# Patient Record
Sex: Female | Born: 1965 | Race: White | Hispanic: No | Marital: Single | Smoking: Never smoker
Health system: Southern US, Community
[De-identification: ages and names within clinical notes are randomized; demographics above are authoritative.]

## PROBLEM LIST (undated history)

## (undated) DIAGNOSIS — J309 Allergic rhinitis, unspecified: Secondary | ICD-10-CM

## (undated) DIAGNOSIS — J45909 Unspecified asthma, uncomplicated: Secondary | ICD-10-CM

## (undated) DIAGNOSIS — L309 Dermatitis, unspecified: Secondary | ICD-10-CM

## (undated) HISTORY — DX: Allergic rhinitis, unspecified: J30.9

## (undated) HISTORY — DX: Unspecified asthma, uncomplicated: J45.909

## (undated) HISTORY — DX: Dermatitis, unspecified: L30.9

## (undated) HISTORY — PX: NO PAST SURGERIES: SHX2092

---

## 1998-05-31 ENCOUNTER — Other Ambulatory Visit: Admission: RE | Admit: 1998-05-31 | Discharge: 1998-05-31 | Payer: Self-pay | Admitting: Obstetrics and Gynecology

## 1999-07-01 ENCOUNTER — Other Ambulatory Visit: Admission: RE | Admit: 1999-07-01 | Discharge: 1999-07-01 | Payer: Self-pay | Admitting: Obstetrics and Gynecology

## 1999-11-08 ENCOUNTER — Other Ambulatory Visit: Admission: RE | Admit: 1999-11-08 | Discharge: 1999-11-08 | Payer: Self-pay | Admitting: Obstetrics and Gynecology

## 2000-07-12 ENCOUNTER — Other Ambulatory Visit: Admission: RE | Admit: 2000-07-12 | Discharge: 2000-07-12 | Payer: Self-pay | Admitting: Obstetrics and Gynecology

## 2001-01-23 ENCOUNTER — Other Ambulatory Visit: Admission: RE | Admit: 2001-01-23 | Discharge: 2001-01-23 | Payer: Self-pay | Admitting: Obstetrics and Gynecology

## 2001-07-22 ENCOUNTER — Other Ambulatory Visit: Admission: RE | Admit: 2001-07-22 | Discharge: 2001-07-22 | Payer: Self-pay | Admitting: Obstetrics and Gynecology

## 2002-07-28 ENCOUNTER — Other Ambulatory Visit: Admission: RE | Admit: 2002-07-28 | Discharge: 2002-07-28 | Payer: Self-pay | Admitting: Obstetrics and Gynecology

## 2003-02-19 ENCOUNTER — Encounter: Admission: RE | Admit: 2003-02-19 | Discharge: 2003-02-19 | Payer: Self-pay | Admitting: Family Medicine

## 2003-02-24 ENCOUNTER — Encounter: Admission: RE | Admit: 2003-02-24 | Discharge: 2003-03-23 | Payer: Self-pay | Admitting: Family Medicine

## 2003-10-28 ENCOUNTER — Other Ambulatory Visit: Admission: RE | Admit: 2003-10-28 | Discharge: 2003-10-28 | Payer: Self-pay | Admitting: Obstetrics and Gynecology

## 2004-12-26 ENCOUNTER — Other Ambulatory Visit: Admission: RE | Admit: 2004-12-26 | Discharge: 2004-12-26 | Payer: Self-pay | Admitting: Obstetrics and Gynecology

## 2005-07-05 ENCOUNTER — Encounter: Admission: RE | Admit: 2005-07-05 | Discharge: 2005-07-05 | Payer: Self-pay | Admitting: Family Medicine

## 2010-01-09 ENCOUNTER — Ambulatory Visit (HOSPITAL_BASED_OUTPATIENT_CLINIC_OR_DEPARTMENT_OTHER): Admission: RE | Admit: 2010-01-09 | Discharge: 2010-01-09 | Payer: Self-pay | Admitting: Emergency Medicine

## 2010-01-09 ENCOUNTER — Ambulatory Visit: Payer: Self-pay | Admitting: Diagnostic Radiology

## 2011-11-03 DIAGNOSIS — Z85828 Personal history of other malignant neoplasm of skin: Secondary | ICD-10-CM | POA: Insufficient documentation

## 2014-12-21 ENCOUNTER — Other Ambulatory Visit: Payer: Self-pay | Admitting: Pediatrics

## 2015-12-03 ENCOUNTER — Encounter: Payer: Self-pay | Admitting: Pediatrics

## 2015-12-03 ENCOUNTER — Ambulatory Visit (INDEPENDENT_AMBULATORY_CARE_PROVIDER_SITE_OTHER): Payer: 59 | Admitting: Pediatrics

## 2015-12-03 VITALS — BP 102/80 | HR 72 | Temp 98.5°F | Resp 16 | Ht 64.25 in | Wt 160.6 lb

## 2015-12-03 DIAGNOSIS — J302 Other seasonal allergic rhinitis: Secondary | ICD-10-CM | POA: Insufficient documentation

## 2015-12-03 DIAGNOSIS — Z8669 Personal history of other diseases of the nervous system and sense organs: Secondary | ICD-10-CM | POA: Insufficient documentation

## 2015-12-03 DIAGNOSIS — J4540 Moderate persistent asthma, uncomplicated: Secondary | ICD-10-CM | POA: Insufficient documentation

## 2015-12-03 DIAGNOSIS — J3089 Other allergic rhinitis: Secondary | ICD-10-CM | POA: Diagnosis not present

## 2015-12-03 DIAGNOSIS — Z79899 Other long term (current) drug therapy: Secondary | ICD-10-CM | POA: Insufficient documentation

## 2015-12-03 MED ORDER — AZELASTINE HCL 0.1 % NA SOLN: 2.0000 | Freq: Two times a day (BID) | NASAL | 3 refills | Status: DC | PRN

## 2015-12-03 MED ORDER — MOMETASONE FUROATE 220 MCG/INH IN AEPB: 1.0000 | INHALATION_SPRAY | Freq: Two times a day (BID) | RESPIRATORY_TRACT | 3 refills | Status: DC

## 2015-12-03 MED ORDER — ALBUTEROL SULFATE HFA 108 (90 BASE) MCG/ACT IN AERS: 2.0000 | INHALATION_SPRAY | RESPIRATORY_TRACT | 0 refills | Status: DC | PRN

## 2015-12-03 MED ORDER — MONTELUKAST SODIUM 10 MG PO TABS: 10.0000 mg | ORAL_TABLET | Freq: Every day | ORAL | 3 refills | Status: DC

## 2015-12-03 NOTE — Patient Instructions (Addendum)
Asmanex 220-one puff once or twice a day to prevent coughing or wheezing Ventolin 2 puffs every 4 hours if needed for wheezing or coughing spells Montelukast  10 mg once a day for coughing or wheezing Zyrtec 10 mg once a day for runny nose Rhinocort 1 spray per nostril twice a day if needed for stuffy nose Azelastine 0.1%-2 sprays per nostril twice a day if needed for sinus headache Add prednisone 10 mg twice a day for 4 days, 10 mg on the fifth day. Did the sinus pressure improve? You should have a flu vaccination

## 2015-12-03 NOTE — Progress Notes (Signed)
[address] [place] [address] Dept: [phone]  FOLLOW UP NOTE  Patient ID: Karina Phillips, female    DOB: 06/24/1965  Age: 50 y.o. MRN: [id number] Date of Office Visit: 12/03/2015  Assessment  Chief Complaint: Asthma (cough that lasted 8 weeks, no cough now); Allergies (pain in face and pressure in head, worse in the last year); and Nasal Congestion  HPI Karina Phillips presents for follow-up of asthma and allergic rhinitis.  She usually uses Asmanex 22O-one  puff once a day but may need to use it twice a day if her asthma is not well controlled in  the springtime. She has been complaining of some sinus pressure. Decongestants give her a rapid heartbeat.  Current medications Asmanex 220- one puff once or twice a day, Ventolin 2 puffs every 4 hours if needed, cetirizine 10 mg once a day, montelukast  10 mg once a day. Her other medications are outlined in the chart.   Drug Allergies:  Allergies  Allergen Reactions  . Flexeril [Cyclobenzaprine] Rash  . Penicillin G Rash    Physical Exam: BP 102/80   Pulse 72   Temp 98.5 F (36.9 C) (Oral)   Resp 16   Ht 5' 4.25" (1.632 m)   Wt 160 lb 9.6 oz (72.8 kg)   BMI 27.35 kg/m    Physical Exam  Constitutional: She is oriented to person, place, and time. She appears well-developed and well-nourished.  HENT:  Eyes normal. Ears normal. Nose moderate swelling of nasal turbinates. Pharynx normal.  Neck: Neck supple.  Cardiovascular:  S1 and S2 normal no murmurs  Pulmonary/Chest:  Clear to percussion and auscultation  Lymphadenopathy:    She has no cervical adenopathy.  Neurological: She is alert and oriented to person, place, and time.  Psychiatric: She has a normal mood and affect. Her behavior is normal. Judgment and thought content normal.  Vitals reviewed.   Diagnostics:  FVC 3.07 L FEV1 2.84 L. Predicted FVC 3.60 L predicted FEV1 2.7 L-the spirometry is in the normal range  Assessment and Plan: 1. Moderate  persistent asthma, uncomplicated   2. Other allergic rhinitis   3. Current use of beta blocker   4. History of migraine headaches     Meds ordered this encounter  Medications  . albuterol (VENTOLIN HFA) 108 (90 Base) MCG/ACT inhaler    Sig: Inhale 2 puffs into the lungs every 4 (four) hours as needed for wheezing or shortness of breath.    Dispense:  3 Inhaler    Refill:  0  . montelukast (SINGULAIR) 10 MG tablet    Sig: Take 1 tablet (10 mg total) by mouth at bedtime.    Dispense:  90 tablet    Refill:  3    90 day supply  . mometasone (ASMANEX) 220 MCG/INH inhaler    Sig: Inhale 1 puff into the lungs 2 (two) times daily.    Dispense:  3 Inhaler    Refill:  3    90 day supply  . azelastine (ASTELIN) 0.1 % nasal spray    Sig: Place 2 sprays into both nostrils 2 (two) times daily as needed for rhinitis.    Dispense:  90 mL    Refill:  3    90 day supply    Patient Instructions  Asmanex 220-one puff once or twice a day to prevent coughing or wheezing Ventolin 2 puffs every 4 hours if needed for wheezing or coughing spells Montelukast  10 mg once a  day for coughing or wheezing Zyrtec 10 mg once a day for runny nose Rhinocort 1 spray per nostril twice a day if needed for stuffy nose Azelastine 0.1%-2 sprays per nostril twice a day if needed for sinus headache Add prednisone 10 mg twice a day for 4 days, 10 mg on the fifth day. Did the sinus pressure improve? You should have a flu vaccination   Return in about 6 weeks (around 01/14/2016).    Thank you for the opportunity to care for this patient.  Please do not hesitate to contact me with questions.  Tonette Bihari, M.D.  [facility] [address] [place], [place] [address] [phone]

## 2015-12-29 DIAGNOSIS — J45909 Unspecified asthma, uncomplicated: Secondary | ICD-10-CM | POA: Insufficient documentation

## 2016-01-04 ENCOUNTER — Encounter: Payer: Self-pay | Admitting: Pediatrics

## 2016-01-04 ENCOUNTER — Ambulatory Visit (INDEPENDENT_AMBULATORY_CARE_PROVIDER_SITE_OTHER): Payer: 59 | Admitting: Pediatrics

## 2016-01-04 VITALS — BP 100/70 | HR 68 | Temp 98.0°F | Resp 16

## 2016-01-04 DIAGNOSIS — J4540 Moderate persistent asthma, uncomplicated: Secondary | ICD-10-CM

## 2016-01-04 DIAGNOSIS — Z79899 Other long term (current) drug therapy: Secondary | ICD-10-CM | POA: Diagnosis not present

## 2016-01-04 DIAGNOSIS — J3089 Other allergic rhinitis: Secondary | ICD-10-CM | POA: Diagnosis not present

## 2016-01-04 DIAGNOSIS — Z23 Encounter for immunization: Secondary | ICD-10-CM | POA: Diagnosis not present

## 2016-01-04 NOTE — Patient Instructions (Signed)
Continue on her current treatment plan She received the flu vaccination today Call me if you are not doing well on this treatment plan

## 2016-01-04 NOTE — Progress Notes (Signed)
  [address] [place] [address] Dept: [phone]  FOLLOW UP NOTE  Patient ID: Karina Phillips Piech, female    DOB: 11/01/1965  Age: 50 y.o. MRN: [id number] Date of Office Visit: 01/04/2016  Assessment  Chief Complaint: Asthma (doing good) and Allergies (hoarse, runny nose )  HPI Karina Phillips Charlot presents for follow-up of asthma and allergic rhinitis. Her asthma is well controlled. Her last allergy testing for airborne allergens was in 2008 and she showed  some reactivity to dust mite and molds. Her nasal symptoms are well controlled.  Current medications are Asmanex 220-1 puff once a day, Ventolin 2 puffs every 4 hours if needed, cetirizine 10 mg once a day ,montelukast 10 mg once a day. Her other medications are outlined in the chart   Drug Allergies:  Allergies  Allergen Reactions  . Flexeril [Cyclobenzaprine] Rash  . Penicillin G Rash    Physical Exam: BP 100/70   Pulse 68   Temp 98 F (36.7 C) (Oral)   Resp 16    Physical Exam  Constitutional: She is oriented to person, place, and time. She appears well-developed and well-nourished.  HENT:  Eyes normal. Ears normal. Nose normal. Pharynx normal.  Neck: Neck supple.  Cardiovascular:  S1 and S2 normal no murmurs  Pulmonary/Chest:  Clear to percussion and auscultation  Lymphadenopathy:    She has no cervical adenopathy.  Neurological: She is alert and oriented to person, place, and time.  Psychiatric: She has a normal mood and affect. Her behavior is normal. Judgment and thought content normal.  Vitals reviewed.   Diagnostics:  FVC 2.98 L FEV1 2.82 L. Predicted FVC 3.60 L predicted FEV1 2.87 L-the spirometry is in the normal range  Assessment and Plan: 1. Need for prophylactic vaccination and inoculation against influenza   2. Moderate persistent asthma without complication   3. Other allergic rhinitis   4. Current use of beta blocker     No orders of the defined types were placed in this  encounter.   Patient Instructions  Continue on her current treatment plan She received the flu vaccination today Call me if you are not doing well on this treatment plan   Return in about 6 months (around 07/03/2016).    Thank you for the opportunity to care for this patient.  Please do not hesitate to contact me with questions.  Tonette BihariJ. A. Deissy Guilbert, M.Phillips.  [facility] [address] [place], [place] [address] [phone]

## 2016-10-05 DIAGNOSIS — Z0000 Encounter for general adult medical examination without abnormal findings: Secondary | ICD-10-CM | POA: Insufficient documentation

## 2016-11-13 DIAGNOSIS — D7589 Other specified diseases of blood and blood-forming organs: Secondary | ICD-10-CM | POA: Insufficient documentation

## 2016-11-27 ENCOUNTER — Other Ambulatory Visit: Payer: Self-pay | Admitting: Pediatrics

## 2016-11-27 NOTE — Telephone Encounter (Signed)
RF on Singulair 10 mg given x 1 with no refills, pt needs an OV

## 2016-12-12 ENCOUNTER — Encounter: Payer: Self-pay | Admitting: Pediatrics

## 2016-12-12 ENCOUNTER — Ambulatory Visit (INDEPENDENT_AMBULATORY_CARE_PROVIDER_SITE_OTHER): Payer: 59 | Admitting: Pediatrics

## 2016-12-12 VITALS — BP 104/68 | HR 58 | Temp 98.5°F | Resp 16 | Ht 64.2 in | Wt 164.4 lb

## 2016-12-12 DIAGNOSIS — Z8669 Personal history of other diseases of the nervous system and sense organs: Secondary | ICD-10-CM

## 2016-12-12 DIAGNOSIS — J3089 Other allergic rhinitis: Secondary | ICD-10-CM | POA: Diagnosis not present

## 2016-12-12 DIAGNOSIS — J4540 Moderate persistent asthma, uncomplicated: Secondary | ICD-10-CM

## 2016-12-12 DIAGNOSIS — H6982 Other specified disorders of Eustachian tube, left ear: Secondary | ICD-10-CM | POA: Diagnosis not present

## 2016-12-12 MED ORDER — BUDESONIDE 180 MCG/ACT IN AEPB: INHALATION_SPRAY | RESPIRATORY_TRACT | 2 refills | Status: DC

## 2016-12-12 MED ORDER — ALBUTEROL SULFATE HFA 108 (90 BASE) MCG/ACT IN AERS: 2.0000 | INHALATION_SPRAY | RESPIRATORY_TRACT | 1 refills | Status: DC | PRN

## 2016-12-12 MED ORDER — MONTELUKAST SODIUM 10 MG PO TABS: ORAL_TABLET | ORAL | 3 refills | Status: DC

## 2016-12-12 NOTE — Patient Instructions (Addendum)
Continue using Rhinocort 1 spray per nostril twice a day for stuffy nose if needed Continue cetirizine 10 mg once a day  Continue montelukast 10 mg once a day Begin using Pulmicort 180- 2 puffs once a day to prevent  cough or wheeze Continue Ventolin 2 puffs every 4 hours if needed for cough or wheezing Get a flu shot Continue other medications as listed in the chart Follow up in 1 year  Call us if you're not doing well on this treatment plan these prescription

## 2016-12-12 NOTE — Progress Notes (Signed)
[address] [place] [address] Dept: [phone]  FOLLOW UP NOTE  Patient ID: Karina Phillips, female    DOB: 10-22-1965  Age: 51 y.o. MRN: [id number] Date of Office Visit: 12/12/2016  Assessment  Chief Complaint: Asthma (new ins will not pay for Asmanex.)  HPI Karina Phillips presents for follow-up of asthma and allergic rhinitis. She reports her asthma is well controlled on Asmanex 220 with 1 puff a day. She reports using Ventolin approximately 1 time every 3 months and occasionally before exercise.She has not experienced nocturnal awakenings due to lower respiratory symptoms, nor have activities of daily living been limited. She has required no Emergency Department or Urgent Care visits for her asthma. She has required zero courses of systemic steroids for asthma exacerbations since the last visit. She reports allergic rhinitis is well controlled with Rhinocort 1 spray twice a day and has also been using acupuncture. She does not have any sinus pressure but reports a fullness in her left ear with cracking and popping sounds that is not painful and began about 1 week ago. She denies fever, chills, or sweats.  Drug Allergies:  Allergies  Allergen Reactions  . Flexeril [Cyclobenzaprine] Rash  . Penicillin G Rash    Physical Exam: BP 104/68 (BP Location: Left Arm, Patient Position: Sitting, Cuff Size: Normal)   Pulse (!) 58   Temp 98.5 F (36.9 C) (Oral)   Resp 16   Ht 5' 4.2" (1.631 m)   Wt 164 lb 6.4 oz (74.6 kg)   SpO2 97%   BMI 28.04 kg/m    Physical Exam  Constitutional: She is oriented to person, place, and time. She appears well-developed and well-nourished.  HENT:  Eyes normal. Ears normal. Nose normal. Pharynx normal  Neck: Normal range of motion. Neck supple.  Cardiovascular:  S1-S2 normal. Regular heart rate and rhythm  Pulmonary/Chest: Breath sounds normal.  Lungs clear to auscultation  Musculoskeletal: Normal range of motion.  Neurological: She is  alert and oriented to person, place, and time.  Skin: Skin is warm and dry.  Psychiatric: She has a normal mood and affect. Her behavior is normal. Judgment and thought content normal.    Diagnostics: FEV1 3.05 FVC 3.38. FEV1 predicted 2.84 FVC predicted 3.58. The  spirometry is in the normal range.    Assessment and Plan: 1. Moderate persistent asthma without complication   2. Other allergic rhinitis   3. History of migraine headaches   4. Eustachian tube dysfunction, left     Meds ordered this encounter  Medications  . budesonide (PULMICORT FLEXHALER) 180 MCG/ACT inhaler    Sig: Two puffs once a day to prevent cough or wheeze. Rinse gargle and spit after use.    Dispense:  3 Inhaler    Refill:  2    HOLD RX.  PATIENT WILL CALL FOR FILL.  Marland Kitchen montelukast (SINGULAIR) 10 MG tablet    Sig: One tablet at bedtime to prevent cough or wheeze.    Dispense:  90 tablet    Refill:  3  . albuterol (VENTOLIN HFA) 108 (90 Base) MCG/ACT inhaler    Sig: Inhale 2 puffs into the lungs every 4 (four) hours as needed for wheezing or shortness of breath.    Dispense:  3 Inhaler    Refill:  1    Patient Instructions  Continue using Rhinocort 1 spray per nostril twice a day for stuffy nose if needed Continue cetirizine 10 mg once a day  Continue montelukast 10 mg  once a day Begin using Pulmicort 180- 2 puffs once a day to prevent  cough or wheeze Continue Ventolin 2 puffs every 4 hours if needed for cough or wheezing Get a flu shot Continue other medications as listed in the chart Follow up in 1 year  Call us if you're not doing well on this treatment plan     Return in about 1 year (around 12/12/2017), or if symptoms worsen or fail to improve.    Thank you for the opportunity to care for this patient.  Please do not hesitate to contact me with questions.  Tonette Bihari, M.D.  [facility] [address] [place], [place] [address] [phone]

## 2017-01-12 ENCOUNTER — Other Ambulatory Visit: Payer: Self-pay

## 2017-01-12 ENCOUNTER — Telehealth: Payer: Self-pay | Admitting: Pediatrics

## 2017-01-12 MED ORDER — PREDNISONE 10 MG PO TABS: ORAL_TABLET | ORAL | 0 refills | Status: DC

## 2017-01-12 NOTE — Telephone Encounter (Signed)
Patient saw Dr. Beaulah DinningBardelas on Oct 9. And she had fluid in her ears. She said Dr. Beaulah DinningBardelas told her if it didn't get better to call and he would prescribe prednisone. She said it hasn't gotten any better and would like this called in to [facility] in [place]. She gave me 2 numbers. [phone] or Cell:[phone].

## 2017-01-12 NOTE — Telephone Encounter (Signed)
Sent in prednisone informed pt

## 2017-01-12 NOTE — Telephone Encounter (Signed)
Please advise if its ok to send in prednisone

## 2017-01-12 NOTE — Telephone Encounter (Signed)
Go ahead and call in prednisone 10 mg tablets. Take 1 tablet twice a day for 4 days, take 1 tablet on the fifth day

## 2017-07-11 ENCOUNTER — Other Ambulatory Visit: Payer: Self-pay | Admitting: Obstetrics and Gynecology

## 2017-07-11 DIAGNOSIS — R928 Other abnormal and inconclusive findings on diagnostic imaging of breast: Secondary | ICD-10-CM

## 2017-07-13 ENCOUNTER — Ambulatory Visit: Payer: Self-pay

## 2017-07-13 ENCOUNTER — Ambulatory Visit
Admission: RE | Admit: 2017-07-13 | Discharge: 2017-07-13 | Disposition: A | Discharge status: Home / Self Care | Payer: 59 | Source: Ambulatory Visit | Attending: Obstetrics and Gynecology | Admitting: Obstetrics and Gynecology

## 2017-07-13 DIAGNOSIS — R928 Other abnormal and inconclusive findings on diagnostic imaging of breast: Secondary | ICD-10-CM

## 2017-11-13 ENCOUNTER — Other Ambulatory Visit: Payer: Self-pay | Admitting: Pediatrics

## 2017-11-13 NOTE — Telephone Encounter (Signed)
Courtesy refill  

## 2017-12-17 ENCOUNTER — Encounter: Payer: Self-pay | Admitting: Pediatrics

## 2017-12-17 ENCOUNTER — Ambulatory Visit (INDEPENDENT_AMBULATORY_CARE_PROVIDER_SITE_OTHER): Payer: 59 | Admitting: Pediatrics

## 2017-12-17 VITALS — BP 106/70 | HR 64 | Temp 98.2°F | Resp 16 | Ht 64.25 in | Wt 171.0 lb

## 2017-12-17 DIAGNOSIS — Z8669 Personal history of other diseases of the nervous system and sense organs: Secondary | ICD-10-CM

## 2017-12-17 DIAGNOSIS — Z23 Encounter for immunization: Secondary | ICD-10-CM

## 2017-12-17 DIAGNOSIS — J3089 Other allergic rhinitis: Secondary | ICD-10-CM

## 2017-12-17 DIAGNOSIS — J4540 Moderate persistent asthma, uncomplicated: Secondary | ICD-10-CM | POA: Diagnosis not present

## 2017-12-17 MED ORDER — MONTELUKAST SODIUM 10 MG PO TABS: ORAL_TABLET | ORAL | 3 refills | Status: DC

## 2017-12-17 MED ORDER — BUDESONIDE 180 MCG/ACT IN AEPB: INHALATION_SPRAY | RESPIRATORY_TRACT | 3 refills | Status: DC

## 2017-12-17 MED ORDER — AZELASTINE HCL 0.1 % NA SOLN: 2.0000 | Freq: Two times a day (BID) | NASAL | 0 refills | Status: DC | PRN

## 2017-12-17 MED ORDER — ALBUTEROL SULFATE HFA 108 (90 BASE) MCG/ACT IN AERS: 2.0000 | INHALATION_SPRAY | RESPIRATORY_TRACT | 1 refills | Status: DC | PRN

## 2017-12-17 NOTE — Progress Notes (Signed)
[address] [place] [address] Dept: [phone]  FOLLOW UP NOTE  Patient ID: Karina Phillips, female    DOB: [birth year]  Age: 52 y.o. MRN: [id number] Date of Office Visit: 12/17/2017  Assessment  Chief Complaint: Asthma  HPI Karina Phillips is a 52 year old female who presents to the clinic for a follow up visit. She reports her asthma as well controlled with only occasional shortness of breath with strenuous activity, about 1 time a month, for which she uses albuterol with resolution of symptoms. Allergic rhinitis is reported as well controlled with Rhinocort, cetirizine and Astelin. Her current medications are listed in the chart.    Drug Allergies:  Allergies  Allergen Reactions  . Flexeril [Cyclobenzaprine] Rash  . Penicillin G Rash    Physical Exam: BP 106/70   Pulse 64   Temp 98.2 F (36.8 C) (Oral)   Resp 16   Ht 5' 4.25" (1.632 m)   Wt 171 lb (77.6 kg)   SpO2 98%   BMI 29.12 kg/m    Physical Exam  Constitutional: She is oriented to person, place, and time. She appears well-developed and well-nourished.  HENT:  Head: Normocephalic.  Right Ear: External ear normal.  Left Ear: External ear normal.  Mouth/Throat: Oropharynx is clear and moist.  Bilateral nares normal. Pharynx normal. Ears normal. Eyes normal.  Eyes: Conjunctivae are normal.  Neck: Normal range of motion. Neck supple.  Cardiovascular: Normal rate and regular rhythm.  No murmur noted  Pulmonary/Chest: Effort normal and breath sounds normal.  Lungs clear to auscultation  Musculoskeletal: Normal range of motion.  Neurological: She is alert and oriented to person, place, and time.  Skin: Skin is warm and dry.  Psychiatric: She has a normal mood and affect. Her behavior is normal. Judgment and thought content normal.  Vitals reviewed.   Diagnostics: FVC 3.09, FEV1 2.87. Predicted FVC 3.56, predicted FEV1 2.82. Spirometry is within the normal range.   Assessment and Plan: 1.  Moderate persistent asthma without complication   2. Other allergic rhinitis   3. History of migraine headaches   4. Need for prophylactic vaccination and inoculation against influenza     Meds ordered this encounter  Medications  . montelukast (SINGULAIR) 10 MG tablet    Sig: TAKE 1 TABLET AT BEDTIME TO PREVENT COUGH OR WHEEZE    Dispense:  90 tablet    Refill:  3  . budesonide (PULMICORT FLEXHALER) 180 MCG/ACT inhaler    Sig: Two puffs once a day to prevent cough or wheeze. Rinse gargle and spit after use.    Dispense:  3 Inhaler    Refill:  3  . albuterol (VENTOLIN HFA) 108 (90 Base) MCG/ACT inhaler    Sig: Inhale 2 puffs into the lungs every 4 (four) hours as needed for wheezing or shortness of breath.    Dispense:  3 Inhaler    Refill:  1  . azelastine (ASTELIN) 0.1 % nasal spray    Sig: Place 2 sprays into both nostrils 2 (two) times daily as needed for rhinitis.    Dispense:  30 mL    Refill:  0    90 day supply    Patient Instructions  Continue using Rhinocort 1 spray in each nostril twice a day as needed for a stuffy nose Continue cetirizine 10 mg once a day as needed for a runny nose Azelastine 0.1%  2 sprays in each nostril twice a day as needed for a sinus headache Continue  montelukast 10 mg once a day to prevent cough or wheeze Continue Pulmicort 180- 2 puffs once a day to prevent  cough or wheeze Continue Ventolin 2 puffs every 4 hours if needed for cough or wheezing. You may use Ventolin 2 puffs 5-15 minutes before activity to decrease cough and wheeze Continue other medications as listed in the chart   Call us if you're not doing well on this treatment plan   Follow up in 1 year or sooner if needed  Return in about 1 year (around 12/18/2018), or if symptoms worsen or fail to improve.   Thank you for the opportunity to care for this patient.  Please do not hesitate to contact me with questions.  Thermon Leyland, FNP [facility]  I have provided oversight concerning Thermon Leyland' evaluation and treatment of this patient's health issues addressed during today's encounter. I agree with the assessment and therapeutic plan as outlined in the note.   Thank you for the opportunity to care for this patient.  Please do not hesitate to contact me with questions.  Tonette Bihari, M.D.  [facility] [address] [place], [place] [address] [phone]

## 2017-12-17 NOTE — Patient Instructions (Addendum)
Continue using Rhinocort 1 spray in each nostril twice a day as needed for a stuffy nose Continue cetirizine 10 mg once a day as needed for a runny nose Azelastine 0.1%  2 sprays in each nostril twice a day as needed for a sinus headache Continue montelukast 10 mg once a day to prevent cough or wheeze Continue Pulmicort 180- 2 puffs once a day to prevent  cough or wheeze Continue Ventolin 2 puffs every 4 hours if needed for cough or wheezing. You may use Ventolin 2 puffs 5-15 minutes before activity to decrease cough and wheeze Continue other medications as listed in the chart   Call us if you're not doing well on this treatment plan   Follow up in 1 year or sooner if needed

## 2018-01-01 DIAGNOSIS — G43009 Migraine without aura, not intractable, without status migrainosus: Secondary | ICD-10-CM | POA: Insufficient documentation

## 2018-01-01 DIAGNOSIS — G4700 Insomnia, unspecified: Secondary | ICD-10-CM | POA: Insufficient documentation

## 2018-07-11 DIAGNOSIS — G43909 Migraine, unspecified, not intractable, without status migrainosus: Secondary | ICD-10-CM | POA: Insufficient documentation

## 2018-10-29 DIAGNOSIS — E538 Deficiency of other specified B group vitamins: Secondary | ICD-10-CM | POA: Insufficient documentation

## 2018-11-18 ENCOUNTER — Other Ambulatory Visit: Payer: Self-pay | Admitting: Family Medicine

## 2018-12-13 ENCOUNTER — Other Ambulatory Visit: Payer: Self-pay | Admitting: Family Medicine

## 2018-12-29 ENCOUNTER — Other Ambulatory Visit: Payer: Self-pay | Admitting: Family Medicine

## 2019-03-08 IMAGING — MG DIGITAL DIAGNOSTIC UNILATERAL LEFT MAMMOGRAM WITH TOMO AND CAD
4 series · 4 of 12 positions shown · non-contrast
Comparison: Previous exam(s).

CLINICAL DATA: Screening recall for a possible mass in the left
breast.

EXAM:
DIGITAL DIAGNOSTIC UNILATERAL LEFT MAMMOGRAM WITH CAD AND TOMO

[L ML synth-2D]
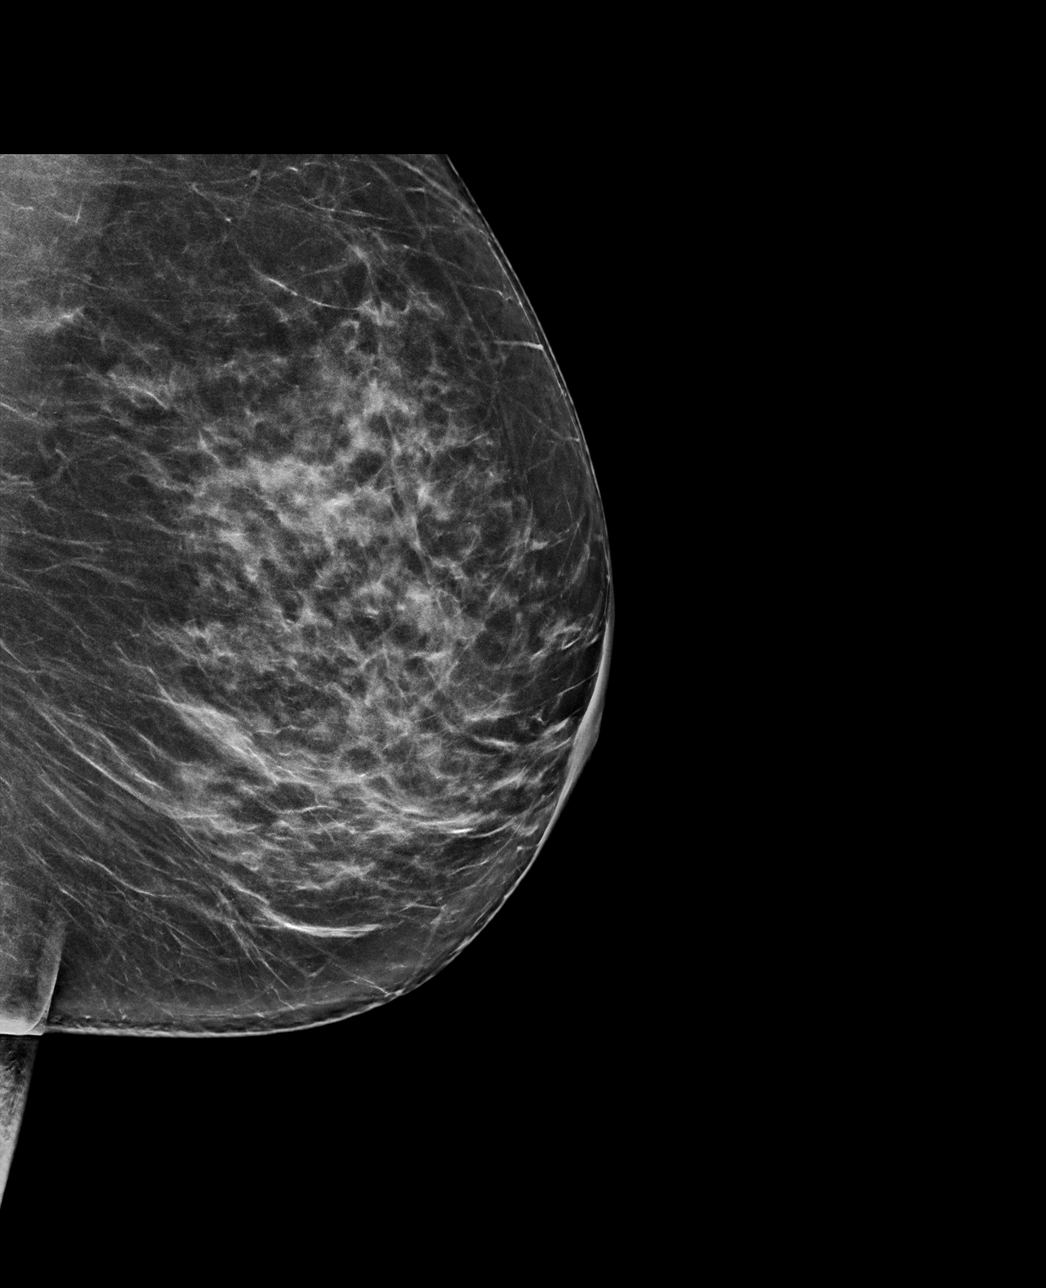

[L MLO synth-2D]
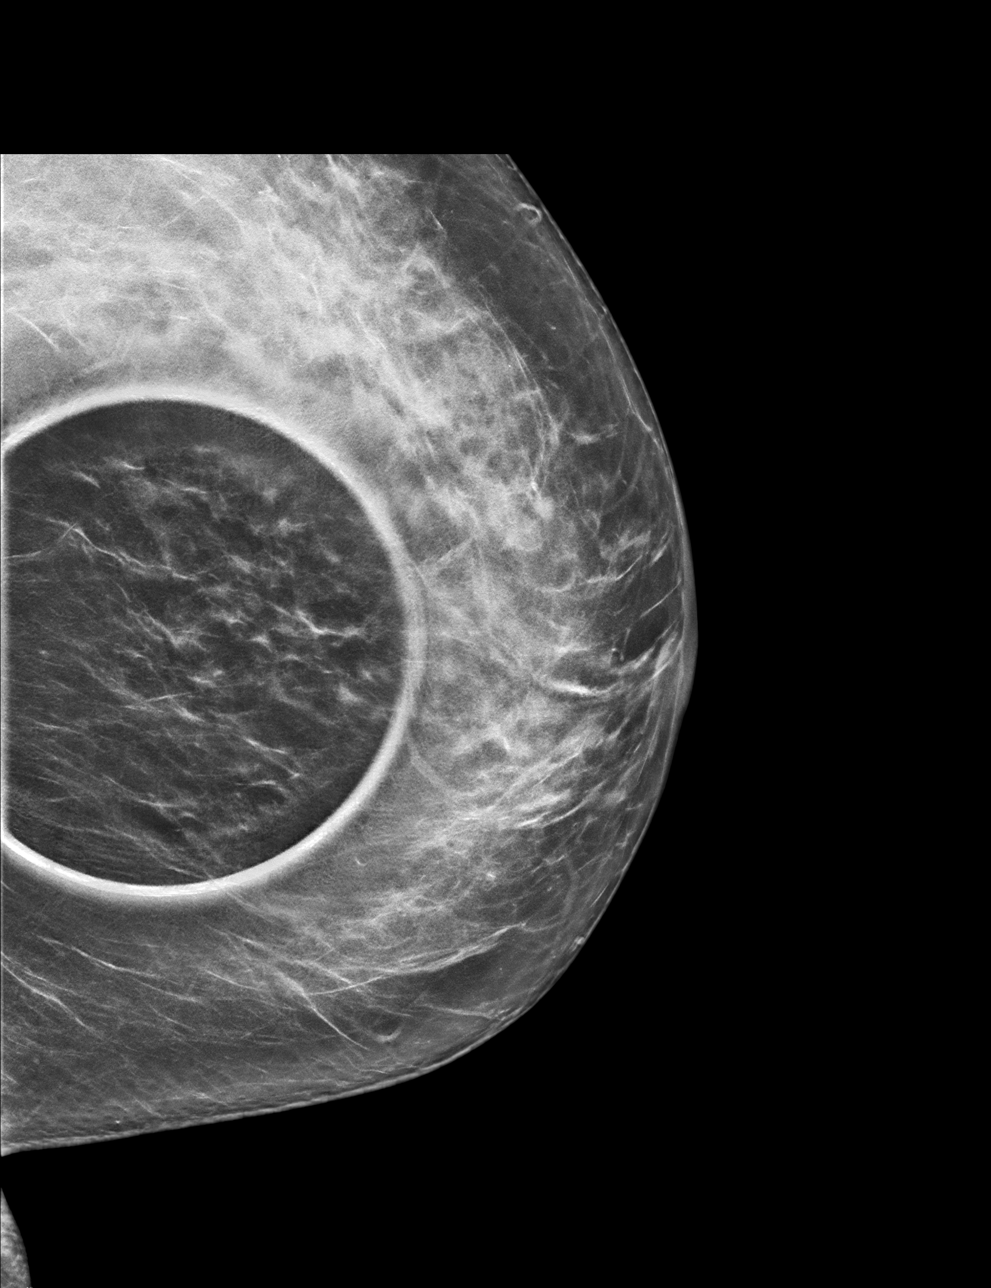

[L MLO tomo · tomo slice 36/71.0]
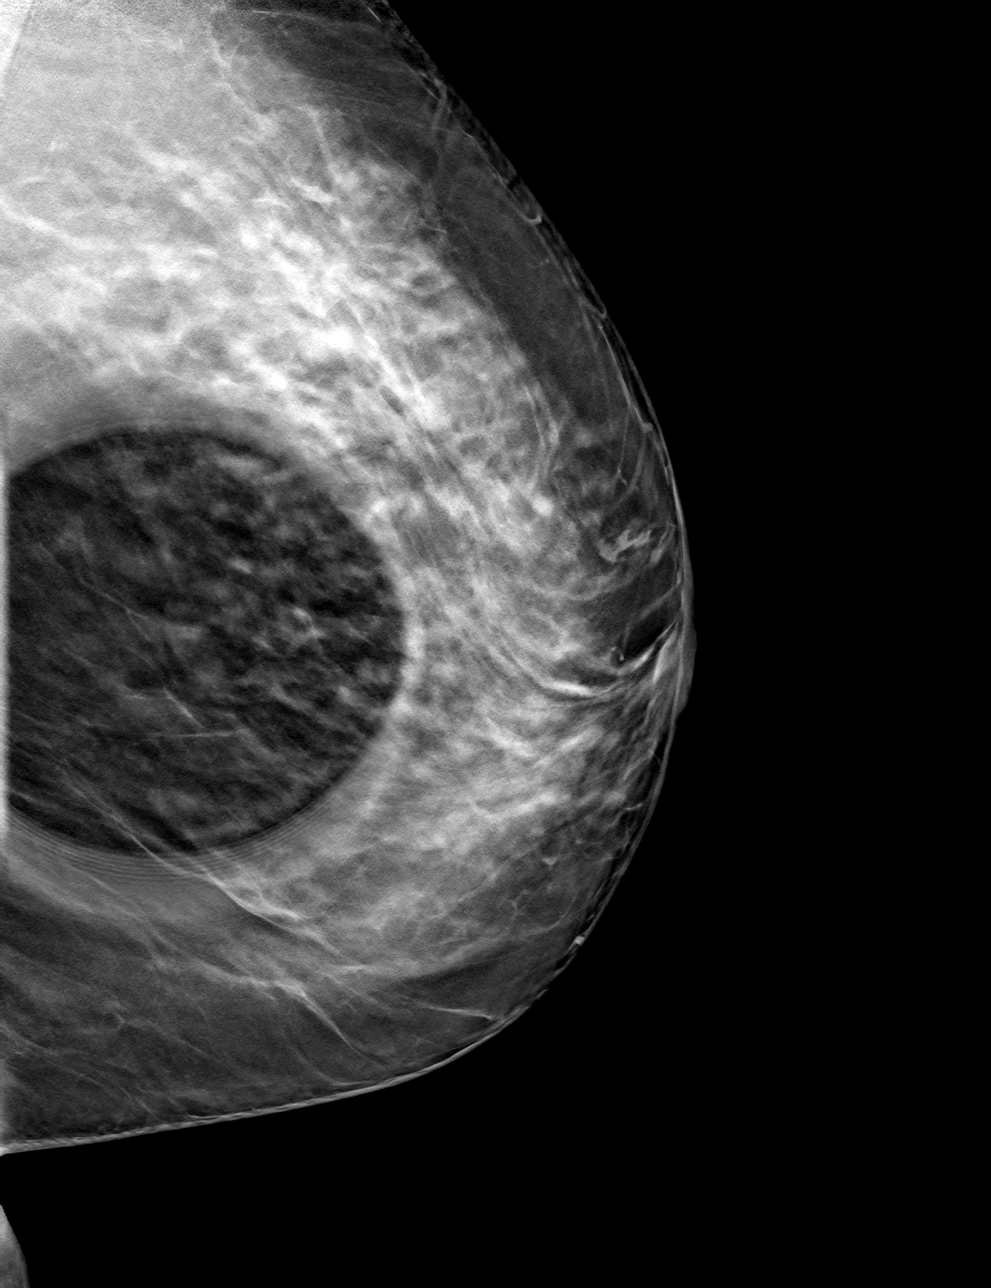

[L ML tomo · tomo slice 40/79.0]
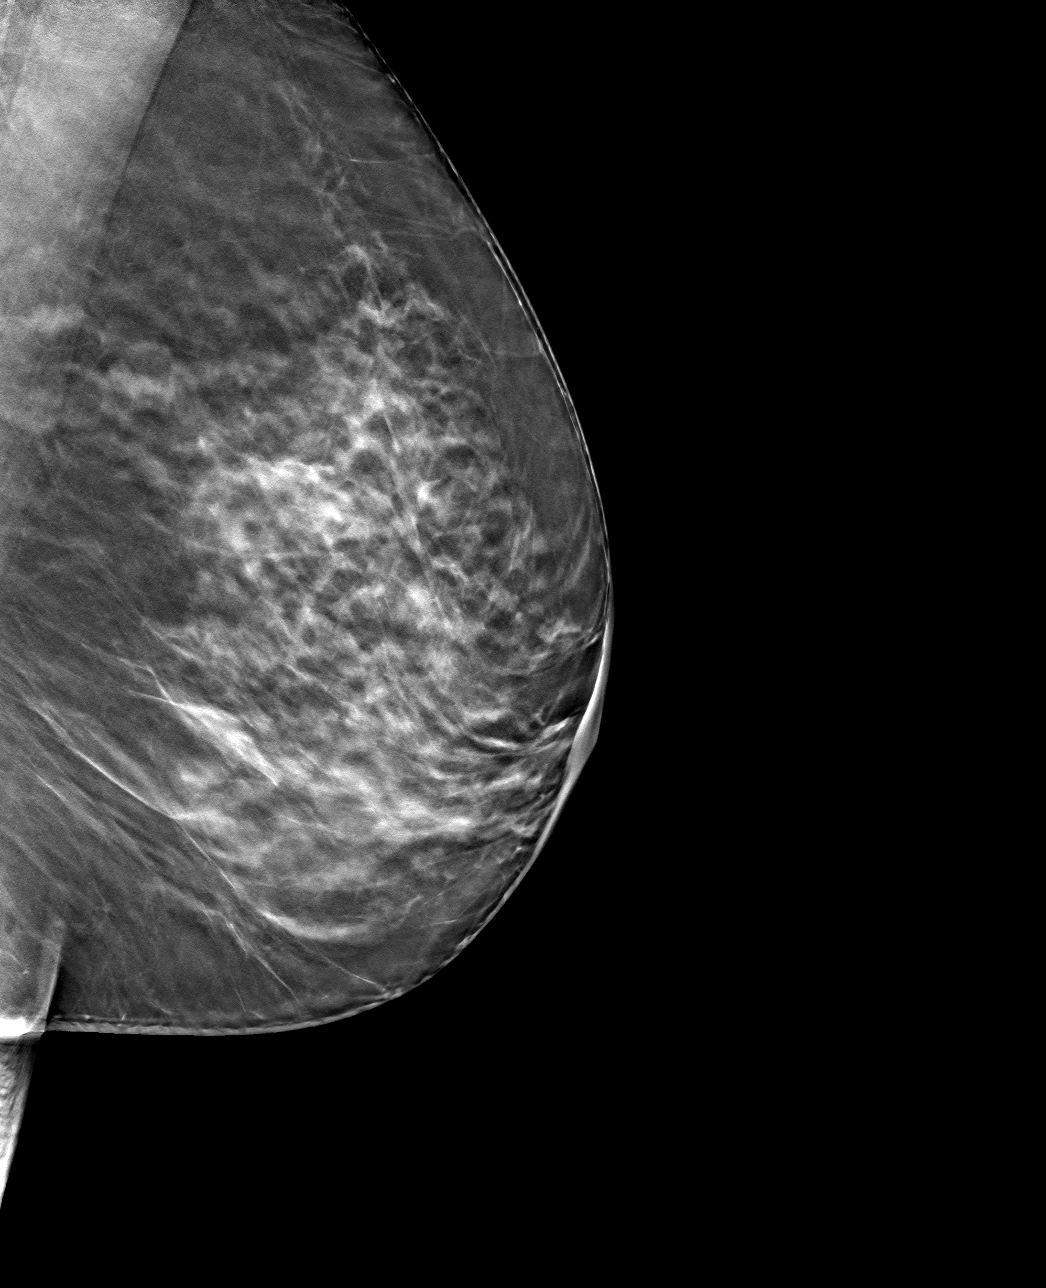

[4 of 12 positions shown; findings below may reference images not displayed]

ACR Breast Density Category c: The breast tissue is heterogeneously
dense, which may obscure small masses.
FINDINGS: The possible mass noted on the screening study, noted on the MLO
view, is not reproduced on the diagnostic mammographic images. There
are no discrete masses, areas of significant asymmetry, areas of
architectural distortion or suspicious calcifications. The screening
abnormality was due to superimposed fibroglandular tissue

Mammographic images were processed with CAD.
IMPRESSION: 1. No evidence of breast malignancy

RECOMMENDATION:
Screening mammogram in one year.(Code:[id number]-[id number])

I have discussed the findings and recommendations with the patient.
Results were also provided in writing at the conclusion of the
visit. If applicable, a reminder letter will be sent to the patient
regarding the next appointment.

BI-RADS CATEGORY  1: Negative.

## 2019-03-18 ENCOUNTER — Ambulatory Visit (INDEPENDENT_AMBULATORY_CARE_PROVIDER_SITE_OTHER): Payer: 59 | Admitting: Pediatrics

## 2019-03-18 ENCOUNTER — Other Ambulatory Visit: Payer: Self-pay

## 2019-03-18 ENCOUNTER — Encounter: Payer: Self-pay | Admitting: Pediatrics

## 2019-03-18 VITALS — BP 100/60 | HR 70 | Temp 98.6°F | Resp 18 | Ht 64.0 in | Wt 174.6 lb

## 2019-03-18 DIAGNOSIS — J3089 Other allergic rhinitis: Secondary | ICD-10-CM

## 2019-03-18 DIAGNOSIS — Z79899 Other long term (current) drug therapy: Secondary | ICD-10-CM

## 2019-03-18 DIAGNOSIS — J4540 Moderate persistent asthma, uncomplicated: Secondary | ICD-10-CM | POA: Diagnosis not present

## 2019-03-18 DIAGNOSIS — J4530 Mild persistent asthma, uncomplicated: Secondary | ICD-10-CM

## 2019-03-18 MED ORDER — ALBUTEROL SULFATE HFA 108 (90 BASE) MCG/ACT IN AERS: 2.0000 | INHALATION_SPRAY | RESPIRATORY_TRACT | 1 refills | Status: DC | PRN

## 2019-03-18 MED ORDER — PULMICORT FLEXHALER 180 MCG/ACT IN AEPB: INHALATION_SPRAY | RESPIRATORY_TRACT | 2 refills | Status: DC

## 2019-03-18 MED ORDER — MONTELUKAST SODIUM 10 MG PO TABS: ORAL_TABLET | ORAL | 3 refills | Status: DC

## 2019-03-18 NOTE — Patient Instructions (Addendum)
Cetirizine 10 mg-take 1 tablet once a day for runny nose or itchy eyes Rhinocort 1 spray per nostril twice a day if needed for stuffy nose Azelastine 0.1% - 2 sprays in each nostril twice a day if needed for sinus headache  Montelukast 10 mg-take 1 tablet once a day to prevent coughing or wheezing Pulmicort 180-1 puff once a day to prevent cough or wheeze but increase to 2 puffs once a day if your asthma is not well controlled I would classify her as mild persistent asthma at this time since she does not have to use Ventolin  Continue on your other medications Call us if you are not doing well on this treatment plan

## 2019-03-18 NOTE — Progress Notes (Signed)
[address] [place] [address] Dept: [phone]  FOLLOW UP NOTE  Patient ID: Karina Phillips, female    DOB: 23-Sep-1965  Age: 54 y.o. MRN: [id number] Date of Office Visit: 03/18/2019  Assessment  Chief Complaint: Asthma (doing well) and Allergic Rhinitis  (always has a runny nose.)  HPI Karina Phillips presents for follow-up of asthma and allergic rhinitis.  Her asthma is well controlled by using Pulmicort 180-2 puffs once a day and montelukast 10 mg once a day.  She very rarely has to use Ventolin. Her nasal symptoms are controlled by cetirizine 10 mg once a day, Rhinocort 1 spray per nostril once a day.  She very rarely has to use Astelin for a sinus headache.   Drug Allergies:  Allergies  Allergen Reactions  . Gabapentin Other (See Comments)    disorientation  . Imipramine Other (See Comments)    Bad dreams  . Topiramate Other (See Comments)    Cognitive slowing  . Flexeril [Cyclobenzaprine] Rash  . Penicillin G Rash  . Penicillins Other (See Comments) and Rash    Skin blisters   . Tizanidine Palpitations    Physical Exam: BP 100/60 (BP Location: Right Arm, Patient Position: Sitting, Cuff Size: Normal)   Pulse 70   Temp 98.6 F (37 C) (Tympanic)   Resp 18   Ht 5\' 4"  (1.626 m)   Wt 174 lb 9.6 oz (79.2 kg)   SpO2 99%   BMI 29.97 kg/m    Physical Exam Vitals reviewed.  Constitutional:      Appearance: Normal appearance. She is normal weight.  HENT:     Head:     Comments: Eyes normal.  Ears normal.  Nose normal.  Pharynx normal. Cardiovascular:     Rate and Rhythm: Normal rate and regular rhythm.     Comments: S1-S2 normal no murmurs Pulmonary:     Comments: Clear to percussion and auscultation Musculoskeletal:     Cervical back: Neck supple.  Lymphadenopathy:     Cervical: No cervical adenopathy.  Neurological:     General: No focal deficit present.     Mental Status: She is alert and oriented to person, place, and time. Mental status is at  baseline.  Psychiatric:        Mood and Affect: Mood normal.        Behavior: Behavior normal.        Thought Content: Thought content normal.        Judgment: Judgment normal.     Diagnostics: FVC 3.07 L FEV1 2.81 L.  Predicted FVC 3.54 L predicted FEV1 2.80 L-the spirometry is in the normal range  Assessment and Plan: 1. Moderate persistent asthma without complication   2. Mild persistent asthma without complication   3. Other allergic rhinitis   4. Current use of beta blocker     Meds ordered this encounter  Medications  . albuterol (VENTOLIN HFA) 108 (90 Base) MCG/ACT inhaler    Sig: Inhale 2 puffs into the lungs every 4 (four) hours as needed for wheezing or shortness of breath.    Dispense:  18 g    Refill:  1  . montelukast (SINGULAIR) 10 MG tablet    Sig: TAKE 1 TABLET AT BEDTIME TO PREVENT COUGH OR WHEEZE    Dispense:  90 tablet    Refill:  3    Dispense 90 days supply  . budesonide (PULMICORT FLEXHALER) 180 MCG/ACT inhaler    Sig: Two puffs once a day  to prevent cough or wheeze. Rinse gargle and spit after use.    Dispense:  3 each    Refill:  2    Dispense 90 days supply    Patient Instructions  Cetirizine 10 mg-take 1 tablet once a day for runny nose or itchy eyes Rhinocort 1 spray per nostril twice a day if needed for stuffy nose Azelastine 0.1% - 2 sprays in each nostril twice a day if needed for sinus headache  Montelukast 10 mg-take 1 tablet once a day to prevent coughing or wheezing Pulmicort 180-1 puff once a day to prevent cough or wheeze but increase to 2 puffs once a day if your asthma is not well controlled I would classify her as mild persistent asthma at this time since she does not have to use Ventolin  Continue on your other medications Call us if you are not doing well on this treatment plan   Return in about 6 months (around 09/15/2019).    Thank you for the opportunity to care for this patient.  Please do not hesitate to contact me with  questions.  Tonette Bihari, M.D.  [facility] [address] [place], [place] [address] [phone]

## 2019-07-28 ENCOUNTER — Other Ambulatory Visit: Payer: Self-pay | Admitting: Obstetrics and Gynecology

## 2019-07-28 DIAGNOSIS — R928 Other abnormal and inconclusive findings on diagnostic imaging of breast: Secondary | ICD-10-CM

## 2019-08-06 ENCOUNTER — Other Ambulatory Visit: Payer: Self-pay

## 2019-08-06 ENCOUNTER — Ambulatory Visit: Payer: 59

## 2019-08-06 ENCOUNTER — Ambulatory Visit
Admission: RE | Admit: 2019-08-06 | Discharge: 2019-08-06 | Disposition: A | Discharge status: Home / Self Care | Payer: 59 | Source: Ambulatory Visit | Attending: Obstetrics and Gynecology | Admitting: Obstetrics and Gynecology

## 2019-08-06 DIAGNOSIS — R928 Other abnormal and inconclusive findings on diagnostic imaging of breast: Secondary | ICD-10-CM

## 2020-01-03 ENCOUNTER — Other Ambulatory Visit: Payer: Self-pay | Admitting: Pediatrics

## 2020-01-19 ENCOUNTER — Other Ambulatory Visit: Payer: Self-pay | Admitting: Pediatrics

## 2020-04-26 ENCOUNTER — Other Ambulatory Visit: Payer: Self-pay

## 2020-04-26 ENCOUNTER — Ambulatory Visit (INDEPENDENT_AMBULATORY_CARE_PROVIDER_SITE_OTHER): Payer: 59 | Admitting: Family Medicine

## 2020-04-26 ENCOUNTER — Encounter: Payer: Self-pay | Admitting: Family Medicine

## 2020-04-26 VITALS — BP 100/70 | HR 64 | Temp 98.4°F | Resp 12 | Ht 64.0 in | Wt 174.2 lb

## 2020-04-26 DIAGNOSIS — Z79899 Other long term (current) drug therapy: Secondary | ICD-10-CM | POA: Diagnosis not present

## 2020-04-26 DIAGNOSIS — J3089 Other allergic rhinitis: Secondary | ICD-10-CM | POA: Diagnosis not present

## 2020-04-26 DIAGNOSIS — J4540 Moderate persistent asthma, uncomplicated: Secondary | ICD-10-CM | POA: Diagnosis not present

## 2020-04-26 DIAGNOSIS — J302 Other seasonal allergic rhinitis: Secondary | ICD-10-CM

## 2020-04-26 MED ORDER — PULMICORT FLEXHALER 180 MCG/ACT IN AEPB: INHALATION_SPRAY | RESPIRATORY_TRACT | 1 refills | Status: DC

## 2020-04-26 MED ORDER — ALBUTEROL SULFATE HFA 108 (90 BASE) MCG/ACT IN AERS: 2.0000 | INHALATION_SPRAY | Freq: Four times a day (QID) | RESPIRATORY_TRACT | 1 refills | Status: DC | PRN

## 2020-04-26 MED ORDER — AZELASTINE HCL 0.1 % NA SOLN
NASAL | 1 refills | Status: DC
Start: 2020-04-26 — End: 2023-08-08

## 2020-04-26 MED ORDER — MONTELUKAST SODIUM 10 MG PO TABS: 10.0000 mg | ORAL_TABLET | Freq: Every day | ORAL | 1 refills | Status: DC

## 2020-04-26 NOTE — Patient Instructions (Signed)
Asthma Continue Pulmicort 180-1 puff once a day to prevent cough or wheeze Continue albuterol 2 puffs once every 4 hours as needed for cough or wheeze You may use albuterol 2 puffs 5-15 minutes before activity to decrease cough or wheeze For asthma flares, increase Pulmicort 180 to 2 puffs twice a day for 2 weeks or until cough and wheeze free. Then return to previous dosing  Allergic rhinitis Continue allergen avoidance measures directed toward grass pollen, mold and dust mites as listed below Continue cetirizine 10 mg once a day as needed for a runny nose Continue azelastine 2 sprays in each nostril twice a day as needed for a runny nose Continue Rhinocort nasal spray 1-2 sprays in each nostril once a day as needed for a stuffy nose.   Call the clinic if this treatment plan is not working well for you  Follow up in 1 year or sooner if needed.  Reducing Pollen Exposure The American Academy of Allergy, Asthma and Immunology suggests the following steps to reduce your exposure to pollen during allergy seasons. 1. Do not hang sheets or clothing out to dry; pollen may collect on these items. 2. Do not mow lawns or spend time around freshly cut grass; mowing stirs up pollen. 3. Keep windows closed at night.  Keep car windows closed while driving. 4. Minimize morning activities outdoors, a time when pollen counts are usually at their highest. 5. Stay indoors as much as possible when pollen counts or humidity is high and on windy days when pollen tends to remain in the air longer. 6. Use air conditioning when possible.  Many air conditioners have filters that trap the pollen spores. 7. Use a HEPA room air filter to remove pollen form the indoor air you breathe.  Control of Mold Allergen Mold and fungi can grow on a variety of surfaces provided certain temperature and moisture conditions exist.  Outdoor molds grow on plants, decaying vegetation and soil.  The major outdoor mold, Alternaria and  Cladosporium, are found in very high numbers during hot and dry conditions.  Generally, a late Summer - Fall peak is seen for common outdoor fungal spores.  Rain will temporarily lower outdoor mold spore count, but counts rise rapidly when the rainy period ends.  The most important indoor molds are Aspergillus and Penicillium.  Dark, humid and poorly ventilated basements are ideal sites for mold growth.  The next most common sites of mold growth are the bathroom and the kitchen.  Outdoor [employer] 8. Use air conditioning and keep windows closed 9. Avoid exposure to decaying vegetation. 10. Avoid leaf raking. 11. Avoid grain handling. 12. Consider wearing a face mask if working in moldy areas.  Indoor Mold Control 1. Maintain humidity below 50%. 2. Clean washable surfaces with 5% bleach solution. 3. Remove sources e.g. Contaminated carpets.   Control of Dust Mite Allergen Dust mites play a major role in allergic asthma and rhinitis. They occur in environments with high humidity wherever human skin is found. Dust mites absorb humidity from the atmosphere (ie, they do not drink) and feed on organic matter (including shed human and animal skin). Dust mites are a microscopic type of insect that you cannot see with the naked eye. High levels of dust mites have been detected from mattresses, pillows, carpets, upholstered furniture, bed covers, clothes, soft toys and any woven material. The principal allergen of the dust mite is found in its feces. A gram of dust may contain 1,000 mites and 250,000  fecal particles. Mite antigen is easily measured in the air during house cleaning activities. Dust mites do not bite and do not cause harm to humans, other than by triggering allergies/asthma.  Ways to decrease your exposure to dust mites in your home:  1. Encase mattresses, box springs and pillows with a mite-impermeable barrier or cover  2. Wash sheets, blankets and drapes weekly in hot water (130 F)  with detergent and dry them in a dryer on the hot setting.  3. Have the room cleaned frequently with a vacuum cleaner and a damp dust-mop. For carpeting or rugs, vacuuming with a vacuum cleaner equipped with a high-efficiency particulate air (HEPA) filter. The dust mite allergic individual should not be in a room which is being cleaned and should wait 1 hour after cleaning before going into the room.  4. Do not sleep on upholstered furniture (eg, couches).  5. If possible removing carpeting, upholstered furniture and drapery from the home is ideal. Horizontal blinds should be eliminated in the rooms where the person spends the most time (bedroom, study, television room). Washable vinyl, roller-type shades are optimal.  6. Remove all non-washable stuffed toys from the bedroom. Wash stuffed toys weekly like sheets and blankets above.  7. Reduce indoor humidity to less than 50%. Inexpensive humidity monitors can be purchased at most hardware stores. Do not use a humidifier as can make the problem worse and are not recommended.

## 2020-04-26 NOTE — Progress Notes (Signed)
[address] [place] [address] Dept: [phone]  FOLLOW UP NOTE  Patient ID: Karina Phillips, female    DOB: Feb 01, 1966  Age: 55 y.o. MRN: [id number] Date of Office Visit: 04/26/2020  Assessment  Chief Complaint: Asthma  HPI Karina Phillips is a 55 year old female who presents to the clinic for follow-up visit.  She was last seen in this clinic on 03/18/2019 by Dr. Beaulah Dinning for evaluation of asthma and allergic rhinitis.  At today's visit she reports her asthma has been well controlled with occasional shortness of breath with vigorous activity.  She denies shortness of breath, cough, or wheeze with activity or rest.  She continues montelukast 10 mg once a day and Pulmicort 180-1 puff once a day.  She has not needed to start her asthma action plan since her last visit to this clinic.  Allergic rhinitis is reported as moderately well controlled with nasal congestion as the main symptom.  She continues cetirizine 10 mg once a day and Rhinocort as needed.  She reports poor application technique of Rhinocort.  She continues azelastine and saline nasal sprays about once a month or less.  She reports that azelastine helps to decrease headache.  Her current medications are listed in the chart.   Drug Allergies:  Allergies  Allergen Reactions  . Gabapentin Other (See Comments)    disorientation  . Imipramine Other (See Comments)    Bad dreams  . Topiramate Other (See Comments)    Cognitive slowing  . Flexeril [Cyclobenzaprine] Rash  . Penicillin G Rash  . Penicillins Other (See Comments) and Rash    Skin blisters   . Tizanidine Palpitations    Physical Exam: BP 100/70 (BP Location: Right Arm, Patient Position: Sitting, Cuff Size: Normal)   Pulse 64   Temp 98.4 F (36.9 C) (Tympanic)   Resp 12   Ht 5\' 4"  (1.626 m)   Wt 174 lb 3.2 oz (79 kg)   SpO2 99%   BMI 29.90 kg/m    Physical Exam Vitals reviewed.  Constitutional:      Appearance: Normal appearance.  HENT:      Head: Normocephalic and atraumatic.     Right Ear: Tympanic membrane normal.     Left Ear: Tympanic membrane normal.     Nose:     Comments: Bilateral nares slightly erythematous with clear nasal drainage noted.  Pharynx normal.  Ears normal.  Eyes normal.    Mouth/Throat:     Pharynx: Oropharynx is clear.  Eyes:     Conjunctiva/sclera: Conjunctivae normal.  Cardiovascular:     Rate and Rhythm: Normal rate and regular rhythm.     Heart sounds: Normal heart sounds. No murmur heard.   Pulmonary:     Effort: Pulmonary effort is normal.     Breath sounds: Normal breath sounds.     Comments: Lungs clear to auscultation Musculoskeletal:        General: Normal range of motion.     Cervical back: Normal range of motion and neck supple.  Skin:    General: Skin is warm and dry.  Neurological:     Mental Status: She is alert and oriented to person, place, and time.  Psychiatric:        Mood and Affect: Mood normal.        Behavior: Behavior normal.        Thought Content: Thought content normal.        Judgment: Judgment normal.     Diagnostics:  FVC 2.85, FEV1 2.66.  Predicted FVC 2.52, predicted FEV1 2.78.  Spirometry indicates normal ventilatory function.  Assessment and Plan: 1. Moderate persistent asthma without complication   2. Seasonal and perennial allergic rhinitis   3. Current use of beta blocker     Meds ordered this encounter  Medications  . albuterol (VENTOLIN HFA) 108 (90 Base) MCG/ACT inhaler    Sig: Inhale 2 puffs into the lungs every 6 (six) hours as needed for wheezing or shortness of breath.    Dispense:  1 each    Refill:  1    Please dispense generic albuterol. Please keep refill on file. Pt. Will call when needed. Thank you.  . montelukast (SINGULAIR) 10 MG tablet    Sig: Take 1 tablet (10 mg total) by mouth at bedtime. Take one tablet by mouth once daily at night.    Dispense:  90 tablet    Refill:  1    Please dispense 90 day supply.  . budesonide  (PULMICORT FLEXHALER) 180 MCG/ACT inhaler    Sig: 2 puffs twice daily to prevent coughing or wheezing.    Dispense:  3 each    Refill:  1    Please dispense 90 day supply.  Marland Kitchen azelastine (ASTELIN) 0.1 % nasal spray    Sig: 1-2 sprays per nostril twice daily for runny nose    Dispense:  30 mL    Refill:  1    Please fill this request. Please keep refill on file. Pt. Will call when needed.    Patient Instructions  Asthma Continue Pulmicort 180-1 puff once a day to prevent cough or wheeze Continue albuterol 2 puffs once every 4 hours as needed for cough or wheeze You may use albuterol 2 puffs 5-15 minutes before activity to decrease cough or wheeze For asthma flares, increase Pulmicort 180 to 2 puffs twice a day for 2 weeks or until cough and wheeze free. Then return to previous dosing  Allergic rhinitis Continue allergen avoidance measures directed toward grass pollen, mold and dust mites as listed below Continue cetirizine 10 mg once a day as needed for a runny nose Continue azelastine 2 sprays in each nostril twice a day as needed for a runny nose Continue Rhinocort nasal spray 1-2 sprays in each nostril once a day as needed for a stuffy nose.   Call the clinic if this treatment plan is not working well for you  Follow up in 1 year or sooner if needed.   Return in about 1 year (around 04/26/2021), or if symptoms worsen or fail to improve.    Thank you for the opportunity to care for this patient.  Please do not hesitate to contact me with questions.  Thermon Leyland, FNP [facility]

## 2020-05-04 ENCOUNTER — Other Ambulatory Visit: Payer: Self-pay | Admitting: *Deleted

## 2020-05-04 MED ORDER — ALBUTEROL SULFATE HFA 108 (90 BASE) MCG/ACT IN AERS: 2.0000 | INHALATION_SPRAY | Freq: Four times a day (QID) | RESPIRATORY_TRACT | 1 refills | Status: DC | PRN

## 2020-05-05 ENCOUNTER — Other Ambulatory Visit: Payer: Self-pay

## 2020-05-05 NOTE — Telephone Encounter (Signed)
Error

## 2020-05-06 ENCOUNTER — Telehealth: Payer: Self-pay

## 2020-05-06 MED ORDER — ALBUTEROL SULFATE HFA 108 (90 BASE) MCG/ACT IN AERS
2.0000 | INHALATION_SPRAY | RESPIRATORY_TRACT | 3 refills | Status: DC | PRN
Start: 2020-05-06 — End: 2021-06-27

## 2020-05-06 NOTE — Telephone Encounter (Signed)
Sent in [facility]air to [facility]

## 2020-05-06 NOTE — Telephone Encounter (Signed)
[facility] called stating Ventolin isn't covered by the patients insurance but [employer] is covered.  Please advise on this change.

## 2020-05-07 ENCOUNTER — Telehealth: Payer: Self-pay | Admitting: Family Medicine

## 2020-05-07 MED ORDER — ALBUTEROL SULFATE HFA 108 (90 BASE) MCG/ACT IN AERS: 2.0000 | INHALATION_SPRAY | Freq: Four times a day (QID) | RESPIRATORY_TRACT | 0 refills | Status: DC | PRN

## 2020-05-07 NOTE — Telephone Encounter (Signed)
Refill for albuterol (Ventolin HFA) sent to [facility] mail service x 3 with no refills. Patient informed.

## 2020-05-07 NOTE — Telephone Encounter (Signed)
Pt states pharmacy says she needs to reach out to our office for prescription for generic Albuterol(ventolin).

## 2020-09-03 ENCOUNTER — Other Ambulatory Visit: Payer: Self-pay | Admitting: Family Medicine

## 2021-02-25 ENCOUNTER — Other Ambulatory Visit: Payer: Self-pay | Admitting: Family Medicine

## 2021-05-10 ENCOUNTER — Other Ambulatory Visit: Payer: Self-pay | Admitting: Family Medicine

## 2021-06-02 ENCOUNTER — Ambulatory Visit: Payer: 59 | Admitting: Internal Medicine

## 2021-06-24 NOTE — Progress Notes (Signed)
? ?FOLLOW UP ?Date of Service/Encounter:  06/27/21 ? ? ?Subjective:  ?Karina Phillips (DOB: 1966-01-30) is a 56 y.o. female who returns to the [facility] on 06/27/2021 in re-evaluation of the follopatientwing: asthma and allergic rhinitis ?History obtained from: chart review and patient. ? ?For Review, LV was on 04/26/20  with Gareth Morgan, FNP.  FEV1 was 2.66L ? ?Pertinent History/Diagnostics:  ?- Asthma: controlled on pulmicort 180-1 puff daily ?- Allergic Rhinitis:  ? - SPT environmental panel: grass pollen, mold and dust mites ? ?Today presents for follow-up. ?Asthma: she does have OSB with activity, will use albuterol prior to exercise, but rarely uses-maybe 3 to 4 times.  ?Allergic rhinitis: She uses singulair daily. Usually takes zyrtec in the morning. ?Using rhinocort (budesnoide) as needed. It helps with congestion and doesn't cause bleeding.  She does have azelastine which she rarely use.  Sometimes she feels a migraine is going to come on due to sinus congestion, she will use the azelastine to help prevent this. ?Otherwise she is doing well and is here for refills. ? ? ?Allergies as of 06/27/2021   ? ?   Reactions  ? Gabapentin Other (See Comments)  ? disorientation  ? Imipramine Other (See Comments)  ? Bad dreams  ? Topiramate Other (See Comments)  ? Cognitive slowing  ? Flexeril [cyclobenzaprine] Rash  ? Penicillin G Rash  ? Penicillins Other (See Comments), Rash  ? Skin blisters  ? Tizanidine Palpitations  ? ?  ? ?  ?Medication List  ?  ? ?  ? Accurate as of June 27, 2021 12:36 PM. If you have any questions, ask your nurse or doctor.  ?  ?  ? ?  ? ?STOP taking these medications   ? ?celecoxib 200 MG capsule ?Commonly known as: CELEBREX ?Stopped by: Sigurd Sos, MD ?  ?PROBIOTIC DAILY PO ?Stopped by: Sigurd Sos, MD ?  ? ?  ? ?TAKE these medications   ? ?albuterol 108 (90 Base) MCG/ACT inhaler ?Commonly known as: Ventolin HFA ?Inhale 2 puffs into the lungs every 4 (four) hours as needed for  wheezing or shortness of breath. ?What changed: Another medication with the same name was removed. Continue taking this medication, and follow the directions you see here. ?Changed by: Sigurd Sos, MD ?  ?albuterol 108 (90 Base) MCG/ACT inhaler ?Commonly known as: Ventolin HFA ?Inhale 2 puffs into the lungs every 4 (four) hours as needed for wheezing or shortness of breath. ?What changed: Another medication with the same name was removed. Continue taking this medication, and follow the directions you see here. ?Changed by: Sigurd Sos, MD ?  ?atenolol 50 MG tablet ?Commonly known as: TENORMIN ?Take by mouth. ?  ?azelastine 0.1 % nasal spray ?Commonly known as: ASTELIN ?1-2 sprays per nostril twice daily for runny nose ?What changed: Another medication with the same name was removed. Continue taking this medication, and follow the directions you see here. ?Changed by: Sigurd Sos, MD ?  ?budesonide 32 MCG/ACT nasal spray ?Commonly known as: RHINOCORT AQUA ?Place 1 spray into both nostrils daily. ?  ?Calcium-Magnesium 500-250 MG Tabs ?Take by mouth. ?  ?cetirizine 10 MG tablet ?Commonly known as: ZYRTEC ?Take by mouth. ?  ?clobetasol cream 0.05 % ?Commonly known as: TEMOVATE ?Apply topically. ?  ?desonide 0.05 % cream ?Commonly known as: DESOWEN ?Apply topically. ?  ?Magnesium 250 MG Tabs ?Take by mouth. ?  ?methocarbamol 500 MG tablet ?Commonly known as: ROBAXIN ?500 mg 3 (three) times daily as  needed. ?  ?montelukast 10 MG tablet ?Commonly known as: SINGULAIR ?Take 1 tablet (10 mg total) by mouth at bedtime. ?What changed: Another medication with the same name was removed. Continue taking this medication, and follow the directions you see here. ?Changed by: Sigurd Sos, MD ?  ?Pulmicort Flexhaler 180 MCG/ACT inhaler ?Generic drug: budesonide ?Two puffs once a day to prevent cough or wheeze. Rinse gargle and spit after use. ?  ?Pulmicort Flexhaler 180 MCG/ACT inhaler ?Generic drug: budesonide ?2 puffs twice daily to  prevent coughing or wheezing. ?  ?SUMAtriptan 100 MG tablet ?Commonly known as: IMITREX ?Take by mouth. ?  ?temazepam 30 MG capsule ?Commonly known as: RESTORIL ?Take by mouth. ?  ?VITAMIN D PO ?Take by mouth. ?  ?WOMENS MULTIVITAMIN PO ?Take 1 tablet by mouth daily. ?  ? ?  ? ?Past Medical History:  ?Diagnosis Date  ? AR (allergic rhinitis)   ? Asthma   ? Dermatitis   ? Eczema   ? ?Past Surgical History:  ?Procedure Laterality Date  ? NO PAST SURGERIES    ? ?Otherwise, there have been no changes to her past medical history, surgical history, family history, or social history. ? ?ROS: All others negative except as noted per HPI.  ? ?Objective:  ?BP 120/70   Pulse (!) 59   Temp (!) 97.3 ?F (36.3 ?C) (Temporal)   Resp 16   Ht 5' 4.5" (1.638 m)   Wt 175 lb 9.6 oz (79.7 kg)   SpO2 99%   BMI 29.68 kg/m?  ?Body mass index is 29.68 kg/m?Marland Kitchen ?Physical Exam: ?General Appearance:  Alert, cooperative, no distress, appears stated age  ?Head:  Normocephalic, without obvious abnormality, atraumatic  ?Eyes:  Conjunctiva clear, EOM's intact  ?Nose: Nares normal, normal mucosa and no visible anterior polyps  ?Throat: Lips, tongue normal; teeth and gums normal, normal posterior oropharynx  ?Neck: Supple, symmetrical  ?Lungs:   clear to auscultation bilaterally, Respirations unlabored, no coughing  ?Heart:  regular rate and rhythm and no murmur, Appears well perfused  ?Extremities: No edema  ?Skin: Skin color, texture, turgor normal, no rashes or lesions on visualized portions of skin  ?Neurologic: No gross deficits  ?Spirometry:  ?Tracings reviewed. Her effort: Good reproducible efforts. ?FVC: 2.69L ?FEV1: 2.48L, 90% predicted ?FEV1/FVC ratio: 115% ?Interpretation: Spirometry consistent with normal pattern.  ?Please see scanned spirometry results for details. ? ?Assessment/Plan  ? ?Asthma-mild persistent, controlled ?Continue Pulmicort 180-1 puff once a day to prevent cough or wheeze ?Continue Singulair (montelukast) 10 mg  night ?Continue albuterol 2 puffs once every 4 hours as needed for cough or wheeze ?You may use albuterol 2 puffs 5-15 minutes before activity to decrease cough or wheeze ?For asthma flares, increase Pulmicort 180 to 2 puffs twice a day for 2 weeks or until cough and wheeze free. Then return to previous dosing ? ?Allergic rhinitis-controlled ?Continue allergen avoidance measures directed toward grass pollen, mold and dust mites as listed below ?Continue cetirizine 10 mg once a day as needed for a runny nose ?Continue azelastine 2 sprays in each nostril twice a day as needed for a runny nose ?Continue budesonide nasal spray 1-2 sprays in each nostril once a day as needed for a stuffy nose.  ? ?Call the clinic if this treatment plan is not working well for you ? ?Follow up in 1 year or sooner if needed. ? ?Sigurd Sos, MD  ?[facility] ? ? ? ? ? ? ?

## 2021-06-27 ENCOUNTER — Ambulatory Visit (INDEPENDENT_AMBULATORY_CARE_PROVIDER_SITE_OTHER): Payer: 59 | Admitting: Internal Medicine

## 2021-06-27 ENCOUNTER — Encounter: Payer: Self-pay | Admitting: Internal Medicine

## 2021-06-27 VITALS — BP 120/70 | HR 59 | Temp 97.3°F | Resp 16 | Ht 64.5 in | Wt 175.6 lb

## 2021-06-27 DIAGNOSIS — J3089 Other allergic rhinitis: Secondary | ICD-10-CM

## 2021-06-27 DIAGNOSIS — J4540 Moderate persistent asthma, uncomplicated: Secondary | ICD-10-CM

## 2021-06-27 DIAGNOSIS — J302 Other seasonal allergic rhinitis: Secondary | ICD-10-CM

## 2021-06-27 MED ORDER — ALBUTEROL SULFATE HFA 108 (90 BASE) MCG/ACT IN AERS: 2.0000 | INHALATION_SPRAY | RESPIRATORY_TRACT | 1 refills | Status: DC | PRN

## 2021-06-27 MED ORDER — PULMICORT FLEXHALER 180 MCG/ACT IN AEPB: INHALATION_SPRAY | RESPIRATORY_TRACT | 2 refills | Status: DC

## 2021-06-27 MED ORDER — MONTELUKAST SODIUM 10 MG PO TABS: 10.0000 mg | ORAL_TABLET | Freq: Every day | ORAL | 2 refills | Status: DC

## 2021-06-27 NOTE — Patient Instructions (Addendum)
Asthma ?Continue Pulmicort 180-1 puff once a day to prevent cough or wheeze ?Continue Singulair (montelukast) 10 mg night ?Continue albuterol 2 puffs once every 4 hours as needed for cough or wheeze ?You may use albuterol 2 puffs 5-15 minutes before activity to decrease cough or wheeze ?For asthma flares, increase Pulmicort 180 to 2 puffs twice a day for 2 weeks or until cough and wheeze free. Then return to previous dosing ? ?Allergic rhinitis ?Continue allergen avoidance measures directed toward grass pollen, mold and dust mites as listed below ?Continue cetirizine 10 mg once a day as needed for a runny nose ?Continue azelastine 2 sprays in each nostril twice a day as needed for a runny nose ?Continue budesonide nasal spray 1-2 sprays in each nostril once a day as needed for a stuffy nose.  ? ?Call the clinic if this treatment plan is not working well for you ? ?Follow up in 1 year or sooner if needed. ?It was a pleasure meeting you in clinic today. ? ?Reducing Pollen Exposure ?The American Academy of Allergy, Asthma and Immunology suggests the following steps to reduce your exposure to pollen during allergy seasons. ?Do not hang sheets or clothing out to dry; pollen may collect on these items. ?Do not mow lawns or spend time around freshly cut grass; mowing stirs up pollen. ?Keep windows closed at night.  Keep car windows closed while driving. ?Minimize morning activities outdoors, a time when pollen counts are usually at their highest. ?Stay indoors as much as possible when pollen counts or humidity is high and on windy days when pollen tends to remain in the air longer. ?Use air conditioning when possible.  Many air conditioners have filters that trap the pollen spores. ?Use a HEPA room air filter to remove pollen form the indoor air you breathe. ? ?Control of Mold Allergen ?Mold and fungi can grow on a variety of surfaces provided certain temperature and moisture conditions exist.  Outdoor molds grow on  plants, decaying vegetation and soil.  The major outdoor mold, Alternaria and Cladosporium, are found in very high numbers during hot and dry conditions.  Generally, a late Summer - Fall peak is seen for common outdoor fungal spores.  Rain will temporarily lower outdoor mold spore count, but counts rise rapidly when the rainy period ends.  The most important indoor molds are Aspergillus and Penicillium.  Dark, humid and poorly ventilated basements are ideal sites for mold growth.  The next most common sites of mold growth are the bathroom and the kitchen. ? ?Outdoor [employer] ?Use air conditioning and keep windows closed ?Avoid exposure to decaying vegetation. ?Avoid leaf raking. ?Avoid grain handling. ?Consider wearing a face mask if working in moldy areas. ? ?Indoor Mold Control ?Maintain humidity below 50%. ?Clean washable surfaces with 5% bleach solution. ?Remove sources e.g. Contaminated carpets. ? ? ?Control of Dust Mite Allergen ?Dust mites play a major role in allergic asthma and rhinitis. They occur in environments with high humidity wherever human skin is found. Dust mites absorb humidity from the atmosphere (ie, they do not drink) and feed on organic matter (including shed human and animal skin). Dust mites are a microscopic type of insect that you cannot see with the naked eye. High levels of dust mites have been detected from mattresses, pillows, carpets, upholstered furniture, bed covers, clothes, soft toys and any woven material. The principal allergen of the dust mite is found in its feces. A gram of dust may contain 1,000 mites and 250,000  fecal particles. Mite antigen is easily measured in the air during house cleaning activities. Dust mites do not bite and do not cause harm to humans, other than by triggering allergies/asthma. ? ?Ways to decrease your exposure to dust mites in your home: ? ?1. Encase mattresses, box springs and pillows with a mite-impermeable barrier or cover ? ?2. Wash sheets,  blankets and drapes weekly in hot water (130? F) with detergent and dry them in a dryer on the hot setting. ? ?3. Have the room cleaned frequently with a vacuum cleaner and a damp dust-mop. For carpeting or rugs, vacuuming with a vacuum cleaner equipped with a high-efficiency particulate air (HEPA) filter. The dust mite allergic individual should not be in a room which is being cleaned and should wait 1 hour after cleaning before going into the room. ? ?4. Do not sleep on upholstered furniture (eg, couches). ? ?5. If possible removing carpeting, upholstered furniture and drapery from the home is ideal. Horizontal blinds should be eliminated in the rooms where the person spends the most time (bedroom, study, television room). Washable vinyl, roller-type shades are optimal. ? ?6. Remove all non-washable stuffed toys from the bedroom. Wash stuffed toys weekly like sheets and blankets above. ? ?7. Reduce indoor humidity to less than 50%. Inexpensive humidity monitors can be purchased at most hardware stores. Do not use a humidifier as can make the problem worse and are not recommended. ? ?

## 2021-06-28 ENCOUNTER — Other Ambulatory Visit: Payer: Self-pay

## 2021-06-28 MED ORDER — ALBUTEROL SULFATE HFA 108 (90 BASE) MCG/ACT IN AERS: 2.0000 | INHALATION_SPRAY | RESPIRATORY_TRACT | 1 refills | Status: DC | PRN

## 2022-04-13 ENCOUNTER — Other Ambulatory Visit: Payer: Self-pay | Admitting: Internal Medicine

## 2022-04-13 DIAGNOSIS — J4540 Moderate persistent asthma, uncomplicated: Secondary | ICD-10-CM

## 2022-06-23 ENCOUNTER — Encounter: Payer: Self-pay | Admitting: Internal Medicine

## 2022-06-23 ENCOUNTER — Ambulatory Visit (INDEPENDENT_AMBULATORY_CARE_PROVIDER_SITE_OTHER): Payer: 59 | Admitting: Internal Medicine

## 2022-06-23 VITALS — BP 110/60 | HR 60 | Temp 97.2°F | Resp 12 | Ht 64.0 in | Wt 173.6 lb

## 2022-06-23 DIAGNOSIS — J3089 Other allergic rhinitis: Secondary | ICD-10-CM | POA: Diagnosis not present

## 2022-06-23 DIAGNOSIS — J4540 Moderate persistent asthma, uncomplicated: Secondary | ICD-10-CM | POA: Diagnosis not present

## 2022-06-23 DIAGNOSIS — J302 Other seasonal allergic rhinitis: Secondary | ICD-10-CM

## 2022-06-23 MED ORDER — QVAR REDIHALER 80 MCG/ACT IN AERB: 2.0000 | INHALATION_SPRAY | Freq: Every day | RESPIRATORY_TRACT | 11 refills | Status: DC

## 2022-06-23 MED ORDER — ALBUTEROL SULFATE HFA 108 (90 BASE) MCG/ACT IN AERS: 2.0000 | INHALATION_SPRAY | RESPIRATORY_TRACT | 1 refills | Status: DC | PRN

## 2022-06-23 MED ORDER — MONTELUKAST SODIUM 10 MG PO TABS: 10.0000 mg | ORAL_TABLET | Freq: Every day | ORAL | 3 refills | Status: DC

## 2022-06-23 NOTE — Patient Instructions (Addendum)
Asthma Start Qvar 80 mcg-2 puff once a day to prevent cough or wheeze (switching from pulmicort due to insurance) Continue Singulair (montelukast) 10 mg night Continue albuterol 2 puffs once every 4 hours as needed for cough or wheeze You may use albuterol 2 puffs 5-15 minutes before activity to decrease cough or wheeze For asthma flares, increase QVAR 80 mcg to 2 puffs twice a day for 2 weeks or until cough and wheeze free. Then return to previous dosing  Allergic rhinitis Continue allergen avoidance measures directed toward grass pollen, mold and dust mites as listed below Continue cetirizine 10 mg once a day as needed for a runny nose Continue montelukast 10 mg nightly Continue azelastine 2 sprays in each nostril twice a day as needed for a runny nose Continue budesonide nasal spray 1-2 sprays in each nostril once a day as needed for a stuffy nose.   Call the clinic if this treatment plan is not working well for you  Follow up in 1 year or sooner if needed. It was a pleasure seeing you again in clinic today.  Reducing Pollen Exposure The American Academy of Allergy, Asthma and Immunology suggests the following steps to reduce your exposure to pollen during allergy seasons. Do not hang sheets or clothing out to dry; pollen may collect on these items. Do not mow lawns or spend time around freshly cut grass; mowing stirs up pollen. Keep windows closed at night.  Keep car windows closed while driving. Minimize morning activities outdoors, a time when pollen counts are usually at their highest. Stay indoors as much as possible when pollen counts or humidity is high and on windy days when pollen tends to remain in the air longer. Use air conditioning when possible.  Many air conditioners have filters that trap the pollen spores. Use a HEPA room air filter to remove pollen form the indoor air you breathe.  Control of Mold Allergen Mold and fungi can grow on a variety of surfaces provided  certain temperature and moisture conditions exist.  Outdoor molds grow on plants, decaying vegetation and soil.  The major outdoor mold, Alternaria and Cladosporium, are found in very high numbers during hot and dry conditions.  Generally, a late Summer - Fall peak is seen for common outdoor fungal spores.  Rain will temporarily lower outdoor mold spore count, but counts rise rapidly when the rainy period ends.  The most important indoor molds are Aspergillus and Penicillium.  Dark, humid and poorly ventilated basements are ideal sites for mold growth.  The next most common sites of mold growth are the bathroom and the kitchen.  Outdoor [employer] Use air conditioning and keep windows closed Avoid exposure to decaying vegetation. Avoid leaf raking. Avoid grain handling. Consider wearing a face mask if working in moldy areas.  Indoor Mold Control Maintain humidity below 50%. Clean washable surfaces with 5% bleach solution. Remove sources e.g. Contaminated carpets.   Control of Dust Mite Allergen Dust mites play a major role in allergic asthma and rhinitis. They occur in environments with high humidity wherever human skin is found. Dust mites absorb humidity from the atmosphere (ie, they do not drink) and feed on organic matter (including shed human and animal skin). Dust mites are a microscopic type of insect that you cannot see with the naked eye. High levels of dust mites have been detected from mattresses, pillows, carpets, upholstered furniture, bed covers, clothes, soft toys and any woven material. The principal allergen of the dust mite is  found in its feces. A gram of dust may contain 1,000 mites and 250,000 fecal particles. Mite antigen is easily measured in the air during house cleaning activities. Dust mites do not bite and do not cause harm to humans, other than by triggering allergies/asthma.  Ways to decrease your exposure to dust mites in your home:  1. Encase mattresses, box  springs and pillows with a mite-impermeable barrier or cover  2. Wash sheets, blankets and drapes weekly in hot water (130 F) with detergent and dry them in a dryer on the hot setting.  3. Have the room cleaned frequently with a vacuum cleaner and a damp dust-mop. For carpeting or rugs, vacuuming with a vacuum cleaner equipped with a high-efficiency particulate air (HEPA) filter. The dust mite allergic individual should not be in a room which is being cleaned and should wait 1 hour after cleaning before going into the room.  4. Do not sleep on upholstered furniture (eg, couches).  5. If possible removing carpeting, upholstered furniture and drapery from the home is ideal. Horizontal blinds should be eliminated in the rooms where the person spends the most time (bedroom, study, television room). Washable vinyl, roller-type shades are optimal.  6. Remove all non-washable stuffed toys from the bedroom. Wash stuffed toys weekly like sheets and blankets above.  7. Reduce indoor humidity to less than 50%. Inexpensive humidity monitors can be purchased at most hardware stores. Do not use a humidifier as can make the problem worse and are not recommended.

## 2022-06-23 NOTE — Progress Notes (Signed)
FOLLOW UP Date of Service/Encounter:  06/23/22   Subjective:  Karina Phillips (DOB: [birth year]) is a 57 y.o. female [facility] in re-evaluation of the following: asthma and allergic rhinitis  History obtained from: chart review and patient.  For Review, LV was on 06/27/2021 with Dr.Andreu Drudge seen for routine follow-up. See below for summary of history and diagnostics.  Therapeutic plans/changes recommended: FEV1 90% predicted, she was controlled we continued her medications.  Pertinent History/Diagnostics:  - Asthma: controlled on pulmicort 180-1 puff daily and Singulair - Allergic Rhinitis:  Occasionally will feel congestion leading to migraines, and azelastine is helpful for this.  She uses Nasacort daily.  Follows her worst season.                - SPT environmental panel: grass pollen, mold and dust mites  Today presents for follow-up. She is doing well since her last visit.  She does report receiving a letter from her insurance regarding her Pulmicort Flexhaler which will no longer be covered.  Qvar RediHaler is alternative covered. She continues using Pulmicort 180 mcg 1 puff daily and with that has not required her rescue inhaler outside of prior to exercise.  She continues to take her montelukast daily.  She has not had any asthma flares requiring systemic steroids. Her allergies are currently controlled.  Fall continues to be her worst time of year.  She continues to take cetirizine daily, budesonide daily and only uses azelastine if she feels significant congestion which is helpful to her.   Allergies as of 06/23/2022       Reactions   Gabapentin Other (See Comments)   disorientation   Imipramine Other (See Comments)   Bad dreams   Topiramate Other (See Comments)   Cognitive slowing   Flexeril [cyclobenzaprine] Rash   Penicillin G Rash   Penicillins Other (See Comments), Rash   Skin blisters   Tizanidine Palpitations         Medication List        Accurate as of June 23, 2022  4:20 PM. If you have any questions, ask your nurse or doctor.          albuterol 108 (90 Base) MCG/ACT inhaler Commonly known as: Ventolin HFA Inhale 2 puffs into the lungs every 4 (four) hours as needed for wheezing or shortness of breath.   albuterol 108 (90 Base) MCG/ACT inhaler Commonly known as: Proventil HFA Inhale 2 puffs into the lungs every 4 (four) hours as needed for wheezing or shortness of breath.   atenolol 50 MG tablet Commonly known as: TENORMIN Take by mouth.   azelastine 0.1 % nasal spray Commonly known as: ASTELIN 1-2 sprays per nostril twice daily for runny nose   budesonide 32 MCG/ACT nasal spray Commonly known as: RHINOCORT AQUA Place 1 spray into both nostrils daily.   Calcium-Magnesium 500-250 MG Tabs Take by mouth.   cetirizine 10 MG tablet Commonly known as: ZYRTEC Take by mouth.   clobetasol cream 0.05 % Commonly known as: TEMOVATE Apply topically.   desonide 0.05 % cream Commonly known as: DESOWEN Apply topically.   Magnesium 250 MG Tabs Take by mouth.   methocarbamol 500 MG tablet Commonly known as: ROBAXIN 500 mg 3 (three) times daily as needed.   montelukast 10 MG tablet Commonly known as: SINGULAIR TAKE 1 TABLET BY MOUTH AT  BEDTIME   Pulmicort Flexhaler 180 MCG/ACT inhaler Generic drug: budesonide 2 puffs twice daily to prevent  coughing or wheezing.   SUMAtriptan 100 MG tablet Commonly known as: IMITREX Take by mouth.   temazepam 30 MG capsule Commonly known as: RESTORIL Take by mouth.   VITAMIN D PO Take by mouth.   WOMENS MULTIVITAMIN PO Take 1 tablet by mouth daily.       Past Medical History:  Diagnosis Date   AR (allergic rhinitis)    Asthma    Dermatitis    Eczema    Past Surgical History:  Procedure Laterality Date   NO PAST SURGERIES     Otherwise, there have been no changes to her past medical history, surgical history, family  history, or social history.  ROS: All others negative except as noted per HPI.   Objective:  BP 110/60   Pulse 60   Temp (!) 97.2 F (36.2 C) (Temporal)   Resp 12   Ht  (1.626 m)   Wt 173 lb 9.6 oz (78.7 kg)   SpO2 99%   BMI 29.80 kg/m  Body mass index is 29.8 kg/m. Physical Exam: General Appearance:  Alert, cooperative, no distress, appears stated age  Head:  Normocephalic, without obvious abnormality, atraumatic  Eyes:  Conjunctiva clear, EOM's intact  Nose: Nares normal, hypertrophic turbinates and normal mucosa  Throat: Lips, tongue normal; teeth and gums normal, normal posterior oropharynx  Neck: Supple, symmetrical  Lungs:   clear to auscultation bilaterally, Respirations unlabored, no coughing  Heart:  regular rate and rhythm and no murmur, Appears well perfused  Extremities: No edema  Skin: Skin color, texture, turgor normal and no rashes or lesions on visualized portions of skin  Neurologic: No gross deficits   Spirometry:  Tracings reviewed. Her effort: Good reproducible efforts. FVC: 2.76L FEV1: 2.47L, 90% predicted FEV1/FVC ratio: 111% Interpretation: Spirometry consistent with normal pattern.  Please see scanned spirometry results for details.   Assessment/Plan   Asthma-controlled Start Qvar 80 mcg-2 puff once a day to prevent cough or wheeze (switching from pulmicort due to insurance) Continue Singulair (montelukast) 10 mg night Continue albuterol 2 puffs once every 4 hours as needed for cough or wheeze You may use albuterol 2 puffs 5-15 minutes before activity to decrease cough or wheeze For asthma flares, increase QVAR 80 mcg to 2 puffs twice a day for 2 weeks or until cough and wheeze free. Then return to previous dosing  Allergic rhinitis-controlled Continue allergen avoidance measures directed toward grass pollen, mold and dust mites as listed below Continue cetirizine 10 mg once a day as needed for a runny nose Continue montelukast 10 mg  nightly Continue azelastine 2 sprays in each nostril twice a day as needed for a runny nose Continue budesonide nasal spray 1-2 sprays in each nostril once a day as needed for a stuffy nose.   Call the clinic if this treatment plan is not working well for you  Follow up in 1 year or sooner if needed. It was a pleasure seeing you again in clinic today.  Tonny Bollman, MD  [facility]

## 2022-06-27 ENCOUNTER — Other Ambulatory Visit (HOSPITAL_COMMUNITY): Payer: Self-pay

## 2022-06-27 ENCOUNTER — Telehealth: Payer: Self-pay

## 2022-06-27 ENCOUNTER — Other Ambulatory Visit: Payer: Self-pay

## 2022-06-27 MED ORDER — ALBUTEROL SULFATE HFA 108 (90 BASE) MCG/ACT IN AERS: 2.0000 | INHALATION_SPRAY | RESPIRATORY_TRACT | 1 refills | Status: DC | PRN

## 2022-06-27 NOTE — Telephone Encounter (Signed)
[place] communication from [facility] that they confirming receipt of patients script dated 06/23/2022 for albuterol ( ventolin hfa)108 (90 base) mcg/act inhaler 3 + 1 rf they will ship to patients home

## 2022-07-06 ENCOUNTER — Other Ambulatory Visit: Payer: Self-pay

## 2022-07-06 ENCOUNTER — Encounter: Payer: Self-pay | Admitting: Internal Medicine

## 2022-07-06 MED ORDER — ALBUTEROL SULFATE HFA 108 (90 BASE) MCG/ACT IN AERS: 2.0000 | INHALATION_SPRAY | RESPIRATORY_TRACT | 3 refills | Status: DC | PRN

## 2023-02-15 ENCOUNTER — Other Ambulatory Visit (HOSPITAL_COMMUNITY): Payer: Self-pay

## 2023-05-23 ENCOUNTER — Other Ambulatory Visit: Payer: Self-pay | Admitting: Internal Medicine

## 2023-05-24 NOTE — Telephone Encounter (Signed)
 Refill for Singulair 10 mg sent to [facility] x 1 with no refills. Patient is due for an OV in April.

## 2023-06-19 ENCOUNTER — Other Ambulatory Visit: Payer: Self-pay | Admitting: Internal Medicine

## 2023-07-12 ENCOUNTER — Other Ambulatory Visit: Payer: Self-pay | Admitting: Internal Medicine

## 2023-08-04 ENCOUNTER — Other Ambulatory Visit: Payer: Self-pay | Admitting: Internal Medicine

## 2023-08-08 ENCOUNTER — Encounter: Payer: Self-pay | Admitting: Internal Medicine

## 2023-08-08 ENCOUNTER — Ambulatory Visit (INDEPENDENT_AMBULATORY_CARE_PROVIDER_SITE_OTHER): Payer: 59 | Admitting: Internal Medicine

## 2023-08-08 VITALS — BP 126/78 | HR 72 | Temp 98.9°F | Resp 20 | Ht 65.0 in | Wt 174.0 lb

## 2023-08-08 DIAGNOSIS — J3089 Other allergic rhinitis: Secondary | ICD-10-CM | POA: Diagnosis not present

## 2023-08-08 DIAGNOSIS — J4540 Moderate persistent asthma, uncomplicated: Secondary | ICD-10-CM

## 2023-08-08 DIAGNOSIS — J302 Other seasonal allergic rhinitis: Secondary | ICD-10-CM

## 2023-08-08 MED ORDER — QVAR REDIHALER 80 MCG/ACT IN AERB: 2.0000 | INHALATION_SPRAY | Freq: Every day | RESPIRATORY_TRACT | 11 refills | Status: AC

## 2023-08-08 MED ORDER — ALBUTEROL SULFATE HFA 108 (90 BASE) MCG/ACT IN AERS: 2.0000 | INHALATION_SPRAY | RESPIRATORY_TRACT | 3 refills | Status: AC | PRN

## 2023-08-08 MED ORDER — MONTELUKAST SODIUM 10 MG PO TABS: 10.0000 mg | ORAL_TABLET | Freq: Every day | ORAL | 2 refills | Status: DC

## 2023-08-08 NOTE — Progress Notes (Signed)
 FOLLOW UP Date of Service/Encounter:  08/08/23  Subjective:  Karina Phillips (DOB: 03-17-1965) is a 58 y.o. female who [facility] in re-evaluation of the following:  asthma and allergic rhinitis  History obtained from: chart review and patient.  For Review, LV was on 06/23/23  with Dr.Treyden Hakim seen for routine follow-up. See below for summary of history and diagnostics.   Therapeutic plans/changes recommended: Fev1 80%, asthma controlled with pulmicort  180 1 puff daily. Allergies controlled taking budesonide  and azelastine  nasal spray, zyrtec daily with montelukast . We switched to QVAR  80 due to insurance coverage with plan to take 2 puffs daily.  ----------------------------------------------------- Pertinent History/Diagnostics:  - Asthma: controlled on pulmicort  180-1 puff daily and Singulair  - Allergic Rhinitis:  Occasionally will feel congestion leading to migraines, and azelastine  is helpful for this.  She uses Nasacort daily.  Follows her worst season.                - SPT environmental panel: grass pollen, mold and dust mites --------------------------------------------------- Today presents for follow-up. Discussed the use of AI scribe software for clinical note transcription with the patient, who gave verbal consent to proceed.  History of Present Illness   Karina Phillips is a 58 year old female with asthma and allergies who presents for a follow-up visit.  Her asthma management includes the use of Pulmicort , but she has recently obtained a prescription for Qvar , which she has not yet tried. Her insurance no longer covers Pulmicort  except for the nebulizer solution, which she does not prefer. She uses albuterol  occasionally, primarily before exercise, less than once a month. She describes her asthma as mild without severe attacks. No systemic steroids since last visit.   For her allergies, she takes Singulair  at night and cetirizine  in the morning for nasal and sinus allergies. She uses budesonide  nasal spray most mornings to manage congestion and azelastine  nasal spray infrequently, about once or twice a month, for significant pressure that could lead to migraines.   She purchases some medications, like the generic version of Rhinocort , at [facility], as it is the only one that does not cause epistaxis for her.  All medications reviewed by clinical staff and updated in chart. No new pertinent medical or surgical history except as noted in HPI.  ROS: All others negative except as noted per HPI.   Objective:  BP 126/78 (BP Location: Left Arm, Patient Position: Sitting, Cuff Size: Normal)   Pulse 72   Temp 98.9 F (37.2 C) (Oral)   Resp 20   Ht 5\' 5"  (1.651 m)   Wt 174 lb (78.9 kg)   SpO2 98%   BMI 28.96 kg/m  Body mass index is 28.96 kg/m. Physical Exam: General Appearance:  Alert, cooperative, no distress, appears stated age  Head:  Normocephalic, without obvious abnormality, atraumatic  Eyes:  Conjunctiva clear, EOM's intact  Ears EACs normal bilaterally and normal TMs bilaterally  Nose: Nares normal, hypertrophic turbinates, normal mucosa, and no visible anterior polyps  Throat: Lips, tongue normal; teeth and gums normal, normal posterior oropharynx  Neck: Supple, symmetrical  Lungs:   clear to auscultation bilaterally and wheezing throughout, Respirations unlabored, no coughing  Heart:  regular rate and rhythm and no murmur, Appears well perfused  Extremities: No edema  Skin: Skin color, texture, turgor normal and no rashes or lesions on visualized portions of skin  Neurologic: No gross deficits   Labs:  Lab Orders  No laboratory test(s)  ordered today   Spirometry:  Tracings reviewed. Her effort: Good reproducible efforts. FVC: 2.63L FEV1: 2.43L, 90% predicted FEV1/FVC ratio: 0.92 Interpretation: Spirometry consistent with normal pattern.  Please see scanned spirometry results for  details.  Assessment/Plan   Asthma-at goal Qvar  80 mcg-2 puff once a day to prevent cough or wheeze (switching from pulmicort  due to insurance) Continue Singulair  (montelukast ) 10 mg night Continue albuterol  2 puffs once every 4 hours as needed for cough or wheeze You may use albuterol  2 puffs 5-15 minutes before activity to decrease cough or wheeze For asthma flares, increase QVAR  80 mcg to 2 puffs twice a day for 2 weeks or until cough and wheeze free. Then return to previous dosing  Allergic rhinitis-at goal Continue allergen avoidance measures directed toward grass pollen, mold and dust mites as listed below Continue cetirizine 10 mg once a day as needed for a runny nose Continue montelukast  10 mg nightly Continue azelastine  2 sprays in each nostril twice a day as needed for a runny nose Continue budesonide  nasal spray 1-2 sprays in each nostril once a day as needed for a stuffy nose.   Call the clinic if this treatment plan is not working well for you  Follow up in 1 year or sooner if needed. It was a pleasure seeing you again in clinic today.  Other: none  Jonathon Neighbors, MD  [facility]  

## 2023-08-08 NOTE — Patient Instructions (Addendum)
 Asthma-at goal Qvar  80 mcg-2 puff once a day to prevent cough or wheeze (switching from pulmicort  due to insurance) Continue Singulair  (montelukast ) 10 mg night Continue albuterol  2 puffs once every 4 hours as needed for cough or wheeze You may use albuterol  2 puffs 5-15 minutes before activity to decrease cough or wheeze For asthma flares, increase QVAR  80 mcg to 2 puffs twice a day for 2 weeks or until cough and wheeze free. Then return to previous dosing  Allergic rhinitis-at goal Continue allergen avoidance measures directed toward grass pollen, mold and dust mites as listed below Continue cetirizine 10 mg once a day as needed for a runny nose Continue montelukast  10 mg nightly Continue azelastine  2 sprays in each nostril twice a day as needed for a runny nose Continue budesonide  nasal spray 1-2 sprays in each nostril once a day as needed for a stuffy nose.   Call the clinic if this treatment plan is not working well for you  Follow up in 1 year or sooner if needed. It was a pleasure seeing you again in clinic today.  Reducing Pollen Exposure The American Academy of Allergy, Asthma and Immunology suggests the following steps to reduce your exposure to pollen during allergy seasons. Do not hang sheets or clothing out to dry; pollen may collect on these items. Do not mow lawns or spend time around freshly cut grass; mowing stirs up pollen. Keep windows closed at night.  Keep car windows closed while driving. Minimize morning activities outdoors, a time when pollen counts are usually at their highest. Stay indoors as much as possible when pollen counts or humidity is high and on windy days when pollen tends to remain in the air longer. Use air conditioning when possible.  Many air conditioners have filters that trap the pollen spores. Use a HEPA room air filter to remove pollen form the indoor air you breathe.  Control of Mold Allergen Mold and fungi can grow on a variety of surfaces  provided certain temperature and moisture conditions exist.  Outdoor molds grow on plants, decaying vegetation and soil.  The major outdoor mold, Alternaria and Cladosporium, are found in very high numbers during hot and dry conditions.  Generally, a late Summer - Fall peak is seen for common outdoor fungal spores.  Rain will temporarily lower outdoor mold spore count, but counts rise rapidly when the rainy period ends.  The most important indoor molds are Aspergillus and Penicillium.  Dark, humid and poorly ventilated basements are ideal sites for mold growth.  The next most common sites of mold growth are the bathroom and the kitchen.  Outdoor [employer] Use air conditioning and keep windows closed Avoid exposure to decaying vegetation. Avoid leaf raking. Avoid grain handling. Consider wearing a face mask if working in moldy areas.  Indoor Mold Control Maintain humidity below 50%. Clean washable surfaces with 5% bleach solution. Remove sources e.g. Contaminated carpets.   Control of Dust Mite Allergen Dust mites play a major role in allergic asthma and rhinitis. They occur in environments with high humidity wherever human skin is found. Dust mites absorb humidity from the atmosphere (ie, they do not drink) and feed on organic matter (including shed human and animal skin). Dust mites are a microscopic type of insect that you cannot see with the naked eye. High levels of dust mites have been detected from mattresses, pillows, carpets, upholstered furniture, bed covers, clothes, soft toys and any woven material. The principal allergen of the dust mite  is found in its feces. A gram of dust may contain 1,000 mites and 250,000 fecal particles. Mite antigen is easily measured in the air during house cleaning activities. Dust mites do not bite and do not cause harm to humans, other than by triggering allergies/asthma.  Ways to decrease your exposure to dust mites in your home:  1. Encase mattresses,  box springs and pillows with a mite-impermeable barrier or cover  2. Wash sheets, blankets and drapes weekly in hot water (130 F) with detergent and dry them in a dryer on the hot setting.  3. Have the room cleaned frequently with a vacuum cleaner and a damp dust-mop. For carpeting or rugs, vacuuming with a vacuum cleaner equipped with a high-efficiency particulate air (HEPA) filter. The dust mite allergic individual should not be in a room which is being cleaned and should wait 1 hour after cleaning before going into the room.  4. Do not sleep on upholstered furniture (eg, couches).  5. If possible removing carpeting, upholstered furniture and drapery from the home is ideal. Horizontal blinds should be eliminated in the rooms where the person spends the most time (bedroom, study, television room). Washable vinyl, roller-type shades are optimal.  6. Remove all non-washable stuffed toys from the bedroom. Wash stuffed toys weekly like sheets and blankets above.  7. Reduce indoor humidity to less than 50%. Inexpensive humidity monitors can be purchased at most hardware stores. Do not use a humidifier as can make the problem worse and are not recommended.

## 2024-03-16 ENCOUNTER — Other Ambulatory Visit: Payer: Self-pay | Admitting: Internal Medicine

## 2024-08-27 ENCOUNTER — Ambulatory Visit: Admitting: Internal Medicine
